# Patient Record
Sex: Female | Born: 1960 | Race: White | Hispanic: No | Marital: Married | Smoking: Former smoker
Health system: Southern US, Community
[De-identification: ages and names within clinical notes are randomized; demographics above are authoritative.]

## PROBLEM LIST (undated history)

## (undated) DIAGNOSIS — Z8619 Personal history of other infectious and parasitic diseases: Secondary | ICD-10-CM

## (undated) DIAGNOSIS — I871 Compression of vein: Secondary | ICD-10-CM

## (undated) DIAGNOSIS — I10 Essential (primary) hypertension: Secondary | ICD-10-CM

## (undated) DIAGNOSIS — K219 Gastro-esophageal reflux disease without esophagitis: Secondary | ICD-10-CM

## (undated) DIAGNOSIS — M7918 Myalgia, other site: Secondary | ICD-10-CM

## (undated) HISTORY — DX: Compression of vein: I87.1

## (undated) HISTORY — DX: Personal history of other infectious and parasitic diseases: Z86.19

## (undated) HISTORY — DX: Essential (primary) hypertension: I10

## (undated) HISTORY — DX: Gastro-esophageal reflux disease without esophagitis: K21.9

## (undated) HISTORY — DX: Myalgia, other site: M79.18

---

## 2007-11-17 ENCOUNTER — Ambulatory Visit (HOSPITAL_COMMUNITY): Admission: RE | Admit: 2007-11-17 | Discharge: 2007-11-17 | Payer: Self-pay | Admitting: Obstetrics and Gynecology

## 2010-08-21 ENCOUNTER — Other Ambulatory Visit (HOSPITAL_COMMUNITY): Payer: Self-pay | Admitting: Obstetrics and Gynecology

## 2010-08-21 DIAGNOSIS — Z1231 Encounter for screening mammogram for malignant neoplasm of breast: Secondary | ICD-10-CM

## 2010-09-01 ENCOUNTER — Ambulatory Visit (HOSPITAL_COMMUNITY)
Admission: RE | Admit: 2010-09-01 | Discharge: 2010-09-01 | Disposition: A | Discharge status: Home / Self Care | Payer: 59 | Source: Ambulatory Visit | Attending: Obstetrics and Gynecology | Admitting: Obstetrics and Gynecology

## 2010-09-01 ENCOUNTER — Encounter (HOSPITAL_COMMUNITY): Payer: Self-pay

## 2010-09-01 DIAGNOSIS — Z1231 Encounter for screening mammogram for malignant neoplasm of breast: Secondary | ICD-10-CM | POA: Insufficient documentation

## 2015-09-17 ENCOUNTER — Ambulatory Visit: Payer: Managed Care, Other (non HMO) | Attending: Obstetrics and Gynecology | Admitting: Physical Therapy

## 2015-09-17 ENCOUNTER — Encounter: Payer: Self-pay | Admitting: Physical Therapy

## 2015-09-17 DIAGNOSIS — R29898 Other symptoms and signs involving the musculoskeletal system: Secondary | ICD-10-CM

## 2015-09-17 DIAGNOSIS — M791 Myalgia, unspecified site: Secondary | ICD-10-CM

## 2015-09-17 DIAGNOSIS — M6289 Other specified disorders of muscle: Secondary | ICD-10-CM | POA: Diagnosis not present

## 2015-09-17 DIAGNOSIS — N949 Unspecified condition associated with female genital organs and menstrual cycle: Secondary | ICD-10-CM | POA: Diagnosis present

## 2015-09-17 DIAGNOSIS — R102 Pelvic and perineal pain: Secondary | ICD-10-CM

## 2015-09-17 DIAGNOSIS — R198 Other specified symptoms and signs involving the digestive system and abdomen: Secondary | ICD-10-CM | POA: Diagnosis present

## 2015-09-17 NOTE — Therapy (Signed)
[facility] [address] [place], [place], [address] Phone: [phone]   Fax:  [phone]  Physical Therapy Evaluation  Patient Details  Name: Jessica Lane MRN: [id number] Date of Birth: 1960/12/26 Referring Provider: Dr. Hollice Gong  Encounter Date: 09/17/2015      PT End of Session - 09/17/15 1645    Visit Number 1   Date for PT Re-Evaluation 01/17/16   PT Start Time 1615   PT Stop Time 1655   PT Time Calculation (min) 40 min   Activity Tolerance Patient tolerated treatment well   Behavior During Therapy [facility] for tasks assessed/performed      History reviewed. No pertinent past medical history.  Past Surgical History  Procedure Laterality Date  . Cesarean section      There were no vitals filed for this visit.  Visit Diagnosis:  Weakness of both hips - Plan: PT plan of care cert/re-cert  Abdominal weakness - Plan: PT plan of care cert/re-cert  Perineal pain in female - Plan: PT plan of care cert/re-cert  Muscle pain - Plan: PT plan of care cert/re-cert      Subjective Assessment - 09/17/15 1615    Subjective Patient reports pain 2 years ago in left hip making it difficult to play tennis.  Patient had increased pain in vaginal area after long period of walking.    Limitations Sitting   How long can you sit comfortably? 30 min.    How long can you walk comfortably? painful   Patient Stated Goals find way to understand exercises to reduce pain   Currently in Pain? Yes   Pain Score 4    Pain Location Vagina  bil. 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 Hollice Gong   Onset Date/Surgical Date 07/12/13   Prior Therapy None   Precautions   Precautions None   Balance Screen   Has the patient fallen in the past 6 months Yes   How many times? 1  running for tennis ball and trip over feet onto left arm   Has the patient had a decrease in activity level because of a fear of falling?  No   Is the patient reluctant to leave their home because of a fear of falling?  No   Prior Function   Level of Independence Independent   Cognition   Overall Cognitive Status Within Functional Limits for tasks assessed   Observation/Other Assessments   Focus on Therapeutic Outcomes (FOTO)  29% limitation   Posture/Postural Control   Posture/Postural Control No significant limitations   AROM   Lumbar Flexion decreased by 25%   Lumbar Extension decreased by 50%   Strength   Overall Strength Comments bil. hip strength is 4/5   Palpation   Spinal mobility T8-L5 decreased P-A mobility   SI assessment  left ilium is anteriorly rotated; rotated right   Palpation comment tenderness located in left psoas, left diaphgram; left gluteal  Pelvic Compression   Findings Positive   Side Left   comment pain                 Pelvic Floor Special Questions - 09/17/15 0001    Prior Pregnancies Yes   Number of Pregnancies 2   Number of C-Sections 1   Number of Vaginal Deliveries 1   Currently Sexually Active No   Marinoff Scale --  when legs are apart pain in vaginal muscles   Urinary Leakage No   External Perineal Exam no palpable tenderness located in external pelvic floor   Skin Integrity Intact   Perineal Body/Introitus  Elevated   Pelvic Floor Internal Exam Patient confirms identificaltion and approves PT to assess muscle integrity    Exam Type Vaginal   Palpation tenderness located in the vulvuar area, therapist is unable to place index finger into the  vaginal canal due to pain and tightness   Tone high tone                  PT Education - 09/17/15 1639    Education provided Yes   Education Details education on melt exericses to release the hip flexors, hip adductors, piriformis   Person(s) Educated Patient   Methods Explanation;Demonstration;Verbal cues   Comprehension Returned demonstration;Verbalized understanding          PT Short Term Goals - 09/17/15 1701    PT SHORT TERM GOAL #1   Title Independent with flexibility exericses   Time 4   Period Weeks   Status New   PT SHORT TERM GOAL #2   Title understand diaphragmatic breathing to relax the pelvic floor muscles   Time 4   Period Weeks   Status New   PT SHORT TERM GOAL #3   Title ability to bring her legs apart and use a q-tip to touch the vulvar area with pain no worse than 3/10.    Time 4   Period Weeks   Status New   PT SHORT TERM GOAL #4   Title walk with pain decreased >/= 25% due to improved flexibility of bil. lower extremities   Time 4   Period Weeks   Status New   PT SHORT TERM GOAL #5   Title understand how to use home TENS unit for pain management   Time 4   Period Months   Status New           PT Long Term Goals - 09/17/15 1703    PT LONG TERM GOAL #1   Title independent with HEP and understands how to progress herself  including pain management   Time 4   Period Months   Status New   PT LONG TERM GOAL #2   Title ability to walk with pain decreased >/= 75% due to improve flexibility of LE and perineal area   Time 4   Period Months   Status New   PT LONG TERM GOAL #3   Title ability to have a vaginal exam due to the introitus enlarged in diameter with pain decreased </= 3/10   Time 4   Period Months   Status New   PT LONG TERM GOAL #4   Title able to sit or stand with 75% decrease in pain    Time 4   Period Months   Status New               Plan - 09/17/15 1647    Clinical Impression Statement Patient  is a 55  year old female with diagnosis of spastic pelvic floor that began suddenly 2 years ago. Patient reports her pain is 4/10 in vaginal area, abdominal, bil. hips and low back. Pain is constant and is getting worse.  Patient reports her pain is worse with walking, bringing legs apart, standing, sitting, and sleeping on side. Bilateral hip strength is 4/5 and abdominal strength is 3/5.  Patient has tight bil. hamstring, quads, hip adductors, hip rotators.  Lumbar flexion decreased by 25% and extension decreased by 50%.  Left ilium is rotated anteriorly and sacrum is rotated right.  Palpable tenderness located in left diaphgram, left gluteal, and left psoas.  Therapist was unable to place her index finger into the vaginal canal due to pain and introitus was decreased in size.  FOTO score is 29% limitation.  Patient is of low complexity.  Patient will benefit from physical therapy to reduce pain and improve flexibility.    Pt will benefit from skilled therapeutic intervention in order to improve on the following deficits Increased fascial restricitons;Pain;Decreased mobility;Increased muscle spasms;Decreased activity tolerance;Decreased endurance;Decreased range of motion;Decreased strength;Hypomobility;Impaired flexibility   Rehab Potential Excellent   Clinical Impairments Affecting Rehab Potential None   PT Frequency 2x / week   PT Duration Other (comment)  4 months   PT Treatment/Interventions ADLs/Self Care Home Management;Biofeedback;Cryotherapy;Electrical Stimulation;Ultrasound;Moist Heat;Functional mobility training;Therapeutic activities;Therapeutic exercise  dilator, home TENS unit   PT Next Visit Plan flexibility exericses, ways to manage pain, soft tissue work, diaphragmatic breathing, educate patient on home TENS unit   PT Home Exercise Plan how to use home TENS unit, flexibility exericse   Recommended Other Services None   Consulted and Agree with Plan of Care Patient         Problem  List There are no active problems to display for this patient.   Eulis FosterCheryl Shaunak Kreis, PT 09/17/2015 5:07 PM   [facility] [address], [address] [place], [place], [address] Phone: [phone]   Fax:  [phone]  Name: Jessica Lane MRN: [id number] Date of Birth: March 07, 1961

## 2015-09-19 ENCOUNTER — Encounter: Payer: Self-pay | Admitting: Physical Therapy

## 2015-09-24 ENCOUNTER — Encounter: Payer: Self-pay | Admitting: Physical Therapy

## 2015-09-24 ENCOUNTER — Ambulatory Visit: Payer: Managed Care, Other (non HMO) | Admitting: Physical Therapy

## 2015-09-24 DIAGNOSIS — R198 Other specified symptoms and signs involving the digestive system and abdomen: Secondary | ICD-10-CM

## 2015-09-24 DIAGNOSIS — R29898 Other symptoms and signs involving the musculoskeletal system: Secondary | ICD-10-CM

## 2015-09-24 DIAGNOSIS — M791 Myalgia, unspecified site: Secondary | ICD-10-CM

## 2015-09-24 DIAGNOSIS — M6289 Other specified disorders of muscle: Secondary | ICD-10-CM | POA: Diagnosis not present

## 2015-09-24 NOTE — Therapy (Signed)
[facility] [address], [address] [place], [place], [address] Phone: [phone]   Fax:  [phone]  Physical Therapy Treatment  Patient Details  Name: Jessica Lane MRN: [id number] Date of Birth: May 14, 1961 Referring Provider: Dr. Hollice GongLauren Schiff  Encounter Date: 09/24/2015      PT End of Session - 09/24/15 1613    Visit Number 2   Date for PT Re-Evaluation 01/17/16   PT Start Time 1610   PT Stop Time 1645   PT Time Calculation (min) 35 min   Activity Tolerance Patient tolerated treatment well   Behavior During Therapy [facility] for tasks assessed/performed      History reviewed. No pertinent past medical history.  Past Surgical History  Procedure Laterality Date  . Cesarean section      There were no vitals filed for this visit.  Visit Diagnosis:  Weakness of both hips  Abdominal weakness  Muscle pain      Subjective Assessment - 09/24/15 1613    Subjective Been super busy. Lt hip was sore after eval. Has only been able to use her foam roll a little. Brought in TENS unit to review.   Currently in Pain? No/denies   Multiple Pain Sites No                         [facility] Adult PT Treatment/Exercise - 09/24/15 0001    Lumbar Exercises: Supine   Other Supine Lumbar Exercises MELT protocol:                 PT Education - 09/24/15 1625    Education provided Yes   Education Details Review of TENS unit for home, where to place for pelvic pain.   Person(s) Educated Patient   Methods Explanation;Demonstration   Comprehension Verbalized understanding;Returned demonstration          PT Short Term Goals - 09/24/15 1621    PT SHORT TERM GOAL #1   Title Independent with flexibility exericses   Time 4   Period Weeks   Status Achieved   PT SHORT TERM GOAL #2   Title understand diaphragmatic breathing to relax the pelvic floor muscles   Time 4   Period Weeks   Status Achieved   PT SHORT  TERM GOAL #4   Title walk with pain decreased >/= 25% due to improved flexibility of bil. lower extremities   Time 4   Status On-going  Early in treatment   PT SHORT TERM GOAL #5   Title understand how to use home TENS unit for pain management   Time 4   Period Months   Status Achieved           PT Long Term Goals - 09/17/15 1703    PT LONG TERM GOAL #1   Title independent with HEP and understands how to progress herself  including pain management   Time 4   Period Months   Status New   PT LONG TERM GOAL #2   Title ability to walk with pain decreased >/= 75% due to improve flexibility of LE and perineal area   Time 4   Period Months   Status New   PT LONG TERM GOAL #3   Title ability to have a vaginal exam due to the introitus enlarged in diameter with pain decreased </= 3/10   Time 4   Period Months   Status New   PT LONG TERM GOAL #4  Title able to sit or stand with 75% decrease in pain    Time 4   Period Months   Status New               Plan - 09/24/15 1613    Clinical Impression Statement Pt reports being very busy and having trouble finding time to do the release work. Educated pt in use of TENS unit for pelvic pain. She is sound in her technique for release work with foam roll.    Pt will benefit from skilled therapeutic intervention in order to improve on the following deficits Increased fascial restricitons;Pain;Decreased mobility;Increased muscle spasms;Decreased activity tolerance;Decreased endurance;Decreased range of motion;Decreased strength;Hypomobility;Impaired flexibility   Rehab Potential Excellent   Clinical Impairments Affecting Rehab Potential None   PT Frequency 2x / week   PT Duration Other (comment)   PT Treatment/Interventions ADLs/Self Care Home Management;Biofeedback;Cryotherapy;Electrical Stimulation;Ultrasound;Moist Heat;Functional mobility training;Therapeutic activities;Therapeutic exercise   PT Next Visit Plan Core strength with  foam roll.    Consulted and Agree with Plan of Care Patient        Problem List There are no active problems to display for this patient.   Mcgwire Dasaro, PTA 09/24/2015, 4:46 PM  [facility] [address] [place], [place], [address] Phone: [phone]   Fax:  [phone]  Name: Jessica Lane MRN: [id number] Date of Birth: 01/22/1961

## 2015-10-03 ENCOUNTER — Ambulatory Visit: Payer: Managed Care, Other (non HMO) | Admitting: Physical Therapy

## 2015-10-03 ENCOUNTER — Encounter: Payer: Self-pay | Admitting: Physical Therapy

## 2015-10-03 DIAGNOSIS — M6289 Other specified disorders of muscle: Secondary | ICD-10-CM | POA: Diagnosis not present

## 2015-10-03 DIAGNOSIS — R29898 Other symptoms and signs involving the musculoskeletal system: Secondary | ICD-10-CM

## 2015-10-03 DIAGNOSIS — M791 Myalgia, unspecified site: Secondary | ICD-10-CM

## 2015-10-03 DIAGNOSIS — R198 Other specified symptoms and signs involving the digestive system and abdomen: Secondary | ICD-10-CM

## 2015-10-03 DIAGNOSIS — R102 Pelvic and perineal pain: Secondary | ICD-10-CM

## 2015-10-03 DIAGNOSIS — N949 Unspecified condition associated with female genital organs and menstrual cycle: Secondary | ICD-10-CM

## 2015-10-03 NOTE — Patient Instructions (Addendum)
Home TENS unit: one lead wire on left sacrum right inner thigh or low abdominal, other lead wire on right sacrum, left inner thigh or low abdominal.  Isometric Hold (Hook-Lying)    Lie with hips and knees bent. Slowly inhale, and then exhale. Pull navel toward spine and Hold for _5__ seconds. Continue to breathe in and out during hold. Rest for _1__ seconds. Repeat _10__ times. Do _2__ times a day.   Copyright  VHI. All rights reserved.  Bracing With Knee Fallout (Hook-Lying)    With neutral spine, tighten pelvic floor and abdominals and hold. Alternating legs, drop knee out to side. Keep opposite hip still. Repeat __10_ times. Then do other leg.  Do _1__ times a day.   Copyright  VHI. All rights reserved.  Lubrication . Used for intercourse to reduce friction . Avoid ones that have glycerin, warming gels, tingling gels, icing or cooling gel, scented . May need to be reapplied once or several times during sexual activity . Can be applied to both partners genitals prior to vaginal penetration to minimize friction or irritation . Prevent irritation and mucosal tears that cause post coital pain and increased the risk of vaginal and urinary tract infections . Oil-based lubricants cannot be used with condoms due to breaking them down.  Least likely to irritate vaginal tissue.  . Plant based-lubes are safe . Silicone-based lubrication are thicker and last long and used for post-menopausal women Types of Lubricants . Good Clean Love (water based)-[employer], [employer], [employer], [employer] . Slippery Stuff(water based) [employer] . Sylk (water based) [employer], [employer] . Blossom Organics- drug store; www.blossom-organics.com . Leatrice JewelsLuvena- Drug store . Coconut oil- will breakdown condoms, least irritating . Aloe Vera- least irritating . Sliquid Natural H20 (water based)-[employer], good if frequent UTI's . Wet Platinum- (Silicone) [employer], [employer] . Yes Lubricant- Amazon . [place] Jelly, Replens, and Astroglide  kills good Bacteria (lactobadilli)  Things to avoid in the vaginal area . Do not use things to irritate the vulvar area . No lotions . No soaps; can use Aveeno or Calendula cleanser if needed. Must be gentle . No deodorants . No douches . Good to sleep without underwear to let the vaginal area to air out . No scrubbing: spread the lips to let warm water rinse over labias and pat dry   Moisturizers . They are used in the vagina to hydrate the mucous membrane that make up the vaginal canal. . Designed to keep a more normal acid balance (ph) . Once placed in the vagina, it will last between two to three days.  . Use 2-3 times per week at bedtime and last longer than 60 min. . Ingredients to avoid is glycerin and fragrance, can increase chance of infection . Should not be used just before sex due to causing irritation . Most are gels administered either in a tampon-shaped applicator or as a vaginal suppository. They are non-hormonal.   Types of Moisturizers . Replens- drug store . Le[employer]vena- drug store . Vitamin E vaginal suppositories- Whole foods . Moist Again . Coconut oil- can break down condoms  Things to avoid in the vaginal area . Do not use things to irritate the vulvar area . No lotions . No soaps; can use Aveeno or Calendula cleanser if needed. Must be gentle . No deodorants . No douches . Good to sleep without underwear to let the vaginal area to air out . No scrubbing: spread the lips to let warm water rinse over labias and pat dry  Daily: use a q-tip and lubricant, rub lubricant on the vaginal area and into the vaginal canal.  Touch around the labia minora with q-tip then insert into the vagina and hold 1 min.      [facility] [address], [address] [place], [place] [address] Phone # [phone] Fax [phone]

## 2015-10-03 NOTE — Therapy (Signed)
[facility] [address] [place], [place], [address] Phone: [phone]   Fax:  [phone]  Physical Therapy Treatment  Patient Details  Name: Jessica Lane MRN: [id number] Date of Birth: 1961-01-24 Referring Provider: Dr. Hollice Gong  Encounter Date: 10/03/2015      PT End of Session - 10/03/15 0803    Visit Number 3   Date for PT Re-Evaluation 01/17/16   Authorization Type [employer]-   Authorization - Visit Number 3   Authorization - Number of Visits 20   PT Start Time 0804   PT Stop Time 0842   PT Time Calculation (min) 38 min   Activity Tolerance Patient tolerated treatment well   Behavior During Therapy [facility] for tasks assessed/performed      History reviewed. No pertinent past medical history.  Past Surgical History  Procedure Laterality Date  . Cesarean section      There were no vitals filed for this visit.  Visit Diagnosis:  Weakness of both hips  Abdominal weakness  Muscle pain  Perineal pain in female      Subjective Assessment - 10/03/15 0804    Subjective I feel great after therapy but the next few days the hips and back are sore.    Limitations Sitting   How long can you sit comfortably? 30 min.    How long can you walk comfortably? painful   Patient Stated Goals find way to understand exercises to reduce pain   Currently in Pain? Yes   Pain Score 4    Pain Location Vagina  bil. hips   Pain Orientation Right;Left;Mid   Pain Descriptors / Indicators Dull   Pain Type Chronic pain   Pain Onset More than a month ago   Pain Frequency Constant   Aggravating Factors  walking   Pain Relieving Factors cymbalta   Multiple Pain Sites No                         [facility] Adult PT Treatment/Exercise - 10/03/15 0001    Self-Care   Self-Care Other Self-Care Comments   Other Self-Care Comments  home TENS unit electrode placement for pelvic pain; using moiturizers and lubricants  in the vaginal area   Manual Therapy   Manual Therapy Other (comment);Soft tissue mobilization   Soft tissue mobilization educated patient on using the q-tip for placing lubricant onto the vaginal perineum, placing into the vaginal canal, hold q-tip into the introitus for 1 min.    Other Manual Therapy manual stretch of bil. hips for adductors and rotators                PT Education - 10/03/15 0815    Education provided Yes   Education Details Home TENS unit, abdominal bracing, abdominal bracing with hip in/out; moisturizers, lubricants, how to use q-tip   Person(s) Educated Patient   Methods Explanation;Demonstration;Verbal cues;Handout   Comprehension Returned demonstration;Verbalized understanding          PT Short Term Goals - 10/03/15 0900    PT SHORT TERM GOAL #1   Title Independent with flexibility exericses   Time 4   Period Weeks   Status Achieved   PT SHORT TERM GOAL #2   Title understand diaphragmatic breathing to relax the pelvic floor muscles   Time 4   Period Weeks   Status Achieved   PT SHORT TERM GOAL #3   Title ability to bring her legs apart and use  a q-tip to touch the vulvar area with pain no worse than 3/10.    Time 4   Period Weeks   Status On-going  just learned, no pain   PT SHORT TERM GOAL #4   Title walk with pain decreased >/= 25% due to improved flexibility of bil. lower extremities   Time 4   Period Weeks   Status On-going  early in treatment   PT SHORT TERM GOAL #5   Title understand how to use home TENS unit for pain management   Time 4   Period Weeks   Status Achieved           PT Long Term Goals - 09/17/15 1703    PT LONG TERM GOAL #1   Title independent with HEP and understands how to progress herself  including pain management   Time 4   Period Months   Status New   PT LONG TERM GOAL #2   Title ability to walk with pain decreased >/= 75% due to improve flexibility of LE and perineal area   Time 4   Period Months    Status New   PT LONG TERM GOAL #3   Title ability to have a vaginal exam due to the introitus enlarged in diameter with pain decreased </= 3/10   Time 4   Period Months   Status New   PT LONG TERM GOAL #4   Title able to sit or stand with 75% decrease in pain    Time 4   Period Months   Status New               Plan - 10/03/15 0841    Clinical Impression Statement Patient has learned how to use the q-tip for insertion of the vaginal to become use to touching the area.  Patient has learned about moisturizers and lubricants to decrease vaginal dryness and will help with pain.  Patient should not contract the vaginal canal due to tightness.  Patient has difficulty with diaphgramatic breathing  due to air going into the chest not the abdomen.  Patient has a better understanding on how to use the Hpem TENS unit on the vaginal area. Patient would benefit from physical therapy to imporve flexibility and reduce pain.    Pt will benefit from skilled therapeutic intervention in order to improve on the following deficits Increased fascial restricitons;Pain;Decreased mobility;Increased muscle spasms;Decreased activity tolerance;Decreased endurance;Decreased range of motion;Decreased strength;Hypomobility;Impaired flexibility   Rehab Potential Excellent   Clinical Impairments Affecting Rehab Potential No pelvic floor contraction due to tightness   PT Frequency 2x / week   PT Duration Other (comment)   PT Treatment/Interventions ADLs/Self Care Home Management;Biofeedback;Cryotherapy;Electrical Stimulation;Ultrasound;Moist Heat;Functional mobility training;Therapeutic activities;Therapeutic exercise   PT Next Visit Plan flexibility exercises for hips- happy baby, hip adductor, hi p rotators, hip flexor, prayer stretch, butterfly stretch, soft tissue work on outside perineum, and hip adductors; Check pelvic alignment   PT Home Exercise Plan flexibility exercises   Consulted and Agree with Plan of  Care Patient        Problem List There are no active problems to display for this patient.   Jessica Lane, PT 10/03/2015 9:03 AM   [facility] [address], [address] [place], [place], [address] Phone: [phone]   Fax:  [phone]  Name: Jessica Lane MRN: [id number] Date of Birth: [birth year]

## 2015-10-13 ENCOUNTER — Encounter: Payer: Self-pay | Admitting: Physical Therapy

## 2015-10-13 ENCOUNTER — Ambulatory Visit: Payer: Managed Care, Other (non HMO) | Attending: Obstetrics and Gynecology | Admitting: Physical Therapy

## 2015-10-13 DIAGNOSIS — R198 Other specified symptoms and signs involving the digestive system and abdomen: Secondary | ICD-10-CM | POA: Insufficient documentation

## 2015-10-13 DIAGNOSIS — M6289 Other specified disorders of muscle: Secondary | ICD-10-CM | POA: Insufficient documentation

## 2015-10-13 DIAGNOSIS — M791 Myalgia: Secondary | ICD-10-CM | POA: Insufficient documentation

## 2015-10-13 DIAGNOSIS — M62838 Other muscle spasm: Secondary | ICD-10-CM | POA: Diagnosis present

## 2015-10-13 NOTE — Therapy (Addendum)
[facility] [address], [place], [place], [address] Phone: [phone]   Fax:  [phone]  Physical Therapy Treatment  Patient Details  Name: Nivea Wojdyla MRN: [id number] Date of Birth: 07-31-1960 Referring Provider: Dr. Yvette Rack  Encounter Date: 10/13/2015      PT End of Session - 10/20/15 1626    Visit Number 5   Date for PT Re-Evaluation 01/17/16   Authorization Type [employer]-   Authorization - Visit Number 5   PT Start Time 2725   PT Stop Time 1700   PT Time Calculation (min) 36 min   Activity Tolerance Patient tolerated treatment well   Behavior During Therapy [facility] for tasks assessed/performed      History reviewed. No pertinent past medical history.  Past Surgical History  Procedure Laterality Date  . Cesarean section      There were no vitals filed for this visit.  Visit Diagnosis:  Other muscle spasm      Subjective Assessment - 10/20/15 1624    Subjective I am feeling better.  We are down to 1/10.    How long can you sit comfortably? several hours   How long can you walk comfortably? walking 95% better.  pain is not at opening instead deeper.    Patient Stated Goals find way to understand exercises to reduce pain   Currently in Pain? 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   Onset Date/Surgical Date 07/12/13   Prior Therapy None   Precautions   Precautions None   Prior Function   Level of Independence Independent   Cognition   Overall Cognitive Status Within Functional Limits for tasks assessed   Observation/Other Assessments   Focus on Therapeutic Outcomes (FOTO)  29% limitation   Posture/Postural Control   Posture/Postural Control No significant limitations   Palpation   SI assessment  pelvis in correct alignment   Pelvic Compression   Findings Negative   comment no pain                  Pelvic Floor Special Questions - 10/20/15 0001    Pelvic Floor Internal Exam Patient confirms identificaltion and approves PT to assess muscle integrity    Exam Type Vaginal   Strength fair squeeze, definite lift           [facility] Adult PT Treatment/Exercise - 10/20/15 0001    Manual Therapy   Manual Therapy Soft tissue mobilization   Soft tissue mobilization left diaphragm and psoas                PT Education - 10/20/15 1655    Education provided Yes   Education Details perineal soft tissue work   Forensic psychologist) Educated Patient   Methods Explanation;Handout;Verbal cues   Comprehension Verbalized understanding          PT Short Term Goals - 10/20/15 1658    PT SHORT TERM GOAL #1   Title Independent with flexibility exericses   Time 4   Period Weeks   Status  Achieved   PT SHORT TERM GOAL #2   Title understand diaphragmatic breathing to relax the pelvic floor muscles   Time 4   Period Weeks   Status Achieved   PT SHORT TERM GOAL #3   Title ability to bring her legs apart and use a q-tip to touch the vulvar area with pain no worse than 3/10.    Time 4   Period Weeks   Status Achieved   PT SHORT TERM GOAL #4   Title walk with pain decreased >/= 25% due to improved flexibility of bil. lower extremities   Time 4   Period Weeks   Status Achieved   PT SHORT TERM GOAL #5   Title understand how to use home TENS unit for pain management   Time 4   Period Weeks   Status Achieved           PT Long Term Goals - 10/20/15 1625    PT LONG TERM GOAL #1   Title independent with HEP and understands how to progress herself  including pain management   Time 4   Period Months   Status  On-going   PT LONG TERM GOAL #2   Title ability to walk with pain decreased >/= 75% due to improve flexibility of LE and perineal area   Time 4   Period Months   Status Achieved   PT LONG TERM GOAL #3   Title ability to have a vaginal exam due to the introitus enlarged in diameter with pain decreased </= 3/10   Time 4   Period Months   Status On-going   PT LONG TERM GOAL #4   Title able to sit or stand with 75% decrease in pain    Time 4   Period Months   Status On-going               Plan - 10/20/15 1651    Clinical Impression Statement Patient was able to tolerate internal soft tissue work for first time. Patient reports her pain is decreased by 95% better.  Patient can walk with no external perineal pain and has internal pain.  Patient is indepednetn with HEP.  Pelvic floor strength is 3/5.  Pelvis in correct  alignment. Patient has met all of her STG's. Patient wil lbenefit form physcial therapy to reduce pain and improve pelvic floor extensibility. .    Rehab Potential Excellent   Clinical Impairments Affecting Rehab Potential No pelvic floor contraction due to tightness   PT Frequency 2x / week   PT Duration Other (comment)  4 months   PT Treatment/Interventions ADLs/Self Care Home Management;Biofeedback;Cryotherapy;Electrical Stimulation;Ultrasound;Moist Heat;Functional mobility training;Therapeutic activities;Therapeutic exercise   PT Next Visit Plan soft tissue work, core stabilization   PT Home Exercise Plan progress as needed   Recommended Other Services None   Consulted and Agree with Plan of Care Patient        Problem List There are no active problems to display for this patient.   GRAY,CHERYL, PTA 10/20/2015, 5:06 PM  [facility] [address], [place], [place], [address] Phone: [phone]   Fax:  [phone]  Name: Maryama Kuriakose MRN: [id number] Date of Birth: Jul 12, 1961

## 2015-10-20 ENCOUNTER — Encounter: Payer: Self-pay | Admitting: Physical Therapy

## 2015-10-20 ENCOUNTER — Ambulatory Visit: Payer: Managed Care, Other (non HMO) | Admitting: Physical Therapy

## 2015-10-20 DIAGNOSIS — M62838 Other muscle spasm: Secondary | ICD-10-CM | POA: Diagnosis not present

## 2015-10-20 NOTE — Patient Instructions (Signed)
STRETCHING THE PELVIC FLOOR MUSCLES NO DILATOR  Supplies . Vaginal lubricant . Mirror (optional) . Gloves (optional) Positioning . Start in a semi-reclined position with your head propped up. Bend your knees and place your thumb or finger at the vaginal opening. Procedure . Apply a moderate amount of lubricant on the outer skin of your vagina, the labia minora.  Apply additional lubricant to your finger. Marland Kitchen. Spread the skin away from the vaginal opening. Place the end of your finger at the opening. . Do a maximum contraction of the pelvic floor muscles. Tighten the vagina and the anus maximally and relax. . When you know they are relaxed, gently and slowly insert your finger into your vagina, directing your finger slightly downward, for 2-3 inches of insertion. . Relax and stretch the 6 o'clock position . Hold each stretch for _2 min__ and repeat __1_ time with rest breaks of _1__ seconds between each stretch. . Repeat the stretching in the 4 o'clock and 8 o'clock positions. . Total time should be _6__ minutes, _1__ x per day.  Note the amount of theme your were able to achieve and your tolerance to your finger in your vagina. . Once you have accomplished the techniques you may try them in standing with one foot resting on the tub, or in other positions.  This is a good stretch to do in the shower if you don't need to use lubricant.    Also massage internally in the pelvic floor muscles to relax them 5 min. Per day in a reclined postion.   [facility] [address], [address] [place], [place] [address] Phone # [phone] Fax [phone]

## 2015-10-20 NOTE — Therapy (Signed)
[facility] [address], [place], [place], [address] Phone: [phone]   Fax:  [phone]  Physical Therapy Treatment  Patient Details  Name: Jessica Lane MRN: [id number] Date of Birth: 03/27/1961 Referring Provider: Dr. Yvette Rack  Encounter Date: 10/20/2015      PT End of Session - 10/20/15 1626    Visit Number 5   Date for PT Re-Evaluation 01/17/16   Authorization Type [employer]-   Authorization - Visit Number 5   PT Start Time 4008   PT Stop Time 1700   PT Time Calculation (min) 36 min   Activity Tolerance Patient tolerated treatment well   Behavior During Therapy [facility] for tasks assessed/performed      History reviewed. No pertinent past medical history.  Past Surgical History  Procedure Laterality Date  . Cesarean section      There were no vitals filed for this visit.      Subjective Assessment - 10/20/15 1624    Subjective I am feeling better.  We are down to 1/10.    How long can you sit comfortably? several hours   How long can you walk comfortably? walking 95% better.  pain is not at opening instead deeper.    Patient Stated Goals find way to understand exercises to reduce pain   Currently in Pain? 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   Onset Date/Surgical Date 07/12/13   Prior Therapy None   Precautions   Precautions None   Prior Function   Level of Independence Independent   Cognition   Overall Cognitive Status Within Functional Limits for tasks assessed   Observation/Other Assessments   Focus on Therapeutic Outcomes (FOTO)  29%  limitation   Posture/Postural Control   Posture/Postural Control No significant limitations   Palpation   SI assessment  pelvis in correct alignment   Pelvic Compression   Findings Negative   comment no pain                  Pelvic Floor Special Questions - 10/20/15 0001    Pelvic Floor Internal Exam Patient confirms identificaltion and approves PT to assess muscle integrity    Exam Type Vaginal   Strength fair squeeze, definite lift           [facility] Adult PT Treatment/Exercise - 10/20/15 0001    Manual Therapy   Manual Therapy Soft tissue mobilization   Soft tissue mobilization left diaphragm and psoas                PT Education - 10/20/15 1655    Education provided Yes   Education Details perineal soft tissue work   Forensic psychologist) Educated Patient   Methods Explanation;Handout;Verbal cues   Comprehension Verbalized understanding          PT Short Term Goals - 10/20/15 1658    PT SHORT TERM GOAL #1   Title Independent with flexibility exericses   Time 4   Period Weeks   Status Achieved   PT SHORT TERM  GOAL #2   Title understand diaphragmatic breathing to relax the pelvic floor muscles   Time 4   Period Weeks   Status Achieved   PT SHORT TERM GOAL #3   Title ability to bring her legs apart and use a q-tip to touch the vulvar area with pain no worse than 3/10.    Time 4   Period Weeks   Status Achieved   PT SHORT TERM GOAL #4   Title walk with pain decreased >/= 25% due to improved flexibility of bil. lower extremities   Time 4   Period Weeks   Status Achieved   PT SHORT TERM GOAL #5   Title understand how to use home TENS unit for pain management   Time 4   Period Weeks   Status Achieved           PT Long Term Goals - 10/20/15 1625    PT LONG TERM GOAL #1   Title independent with HEP and understands how to progress herself  including pain management   Time 4   Period Months   Status On-going   PT LONG TERM GOAL #2   Title  ability to walk with pain decreased >/= 75% due to improve flexibility of LE and perineal area   Time 4   Period Months   Status Achieved   PT LONG TERM GOAL #3   Title ability to have a vaginal exam due to the introitus enlarged in diameter with pain decreased </= 3/10   Time 4   Period Months   Status On-going   PT LONG TERM GOAL #4   Title able to sit or stand with 75% decrease in pain    Time 4   Period Months   Status On-going               Plan - 10/20/15 1651    Clinical Impression Statement Patient was able to tolerate internal soft tissue work for first time. Patient reports her pain is decreased by 95% better.  Patient can walk with no external perineal pain and has internal pain.  Patient is indepednetn with HEP.  Pelvic floor strength is 3/5.  Pelvis in correct  alignment. Patient has met all of her STG's. Patient wil lbenefit form physcial therapy to reduce pain and improve pelvic floor extensibility. .    Rehab Potential Excellent   Clinical Impairments Affecting Rehab Potential No pelvic floor contraction due to tightness   PT Frequency 2x / week   PT Duration Other (comment)  4 months   PT Treatment/Interventions ADLs/Self Care Home Management;Biofeedback;Cryotherapy;Electrical Stimulation;Ultrasound;Moist Heat;Functional mobility training;Therapeutic activities;Therapeutic exercise   PT Next Visit Plan soft tissue work, core stabilization   PT Home Exercise Plan progress as needed   Recommended Other Services None   Consulted and Agree with Plan of Care Patient      Patient will benefit from skilled therapeutic intervention in order to improve the following deficits and impairments:  Increased fascial restricitons, Pain, Decreased mobility, Increased muscle spasms, Decreased activity tolerance, Decreased endurance, Decreased range of motion, Decreased strength, Hypomobility, Impaired flexibility  Visit Diagnosis: Other muscle spasm - Plan: PT plan of care  cert/re-cert     Problem List There are no active problems to display for this patient.   Earlie Counts, PT 10/20/2015 5:02 PM   [facility] [address], [place], [place], [address] Phone: [phone]   Fax:  [phone]  Name: Clea Dubach MRN:  [id number] Date of Birth: [birth year]

## 2015-11-04 ENCOUNTER — Ambulatory Visit: Payer: Managed Care, Other (non HMO) | Admitting: Physical Therapy

## 2015-11-04 DIAGNOSIS — M62838 Other muscle spasm: Secondary | ICD-10-CM | POA: Diagnosis not present

## 2015-11-04 NOTE — Patient Instructions (Signed)
Iliotibial Band Stretch, Side-Lying    Lie on side, back to edge of bed, top arm in front. Allow top leg to drape behind over edge. Hold _30__ seconds.  Repeat _2__ times per session. Do __1 sessions per day.  Copyright  VHI. All rights reserved.    Standing with feet about shoulder width apart and hands in the small of lower back.  Slowly bend backwards as far as possible without forcing the movement.   This is a movement not a stretch so go as far as possible but stay only 1-2 seconds maximum. 5 times after sitting for 30 min.   https://mcdowell-oliver.com/https://www.youtube.com/watch?v=4syPT8gMDDA&t=55sy Guided Meditation for Pelvic Floor Relaxation  [employer]  [facility] [address], [address] [place], [place] [address] Phone # [phone] Fax [phone]

## 2015-11-04 NOTE — Therapy (Signed)
[facility] [address], [address] [place], [place], [address] Phone: [phone]   Fax:  [phone]  Physical Therapy Treatment  Patient Details  Name: Jessica Lane MRN: [id number] Date of Birth: [birth year] Referring Provider: Dr. Hollice GongLauren Schiff  Encounter Date: 11/04/2015      PT End of Session - 11/04/15 0807    Visit Number 6   Date for PT Re-Evaluation 01/17/16   Authorization Type [employer]-   Authorization - Visit Number 6   Authorization - Number of Visits 20   PT Start Time 0804   PT Stop Time 0843   PT Time Calculation (min) 39 min   Activity Tolerance Patient tolerated treatment well   Behavior During Therapy [facility] for tasks assessed/performed      No past medical history on file.  Past Surgical History  Procedure Laterality Date  . Cesarean section      There were no vitals filed for this visit.      Subjective Assessment - 11/04/15 0807    Subjective I have been having trouble getting my exercises done due to planning my daughters wedding. My pain is 80% better.  I went for a walk and hip hurt  du eto wearing different shoes.    Limitations Sitting   How long can you sit comfortably? several hours   How long can you walk comfortably? walking 95% better.  pain is not at opening instead deeper.    Patient Stated Goals find way to understand exercises to reduce pain   Currently in Pain? No/denies            [facility] PT Assessment - 11/04/15 0001    AROM   Lumbar Flexion full   Lumbar Extension decreased by 50%   Strength   Overall Strength Comments bil. hip abduction 4/5                     [facility] Adult PT Treatment/Exercise - 11/04/15 0001    Lumbar Exercises: Prone   Other Prone Lumbar Exercises hip extension with knee flexed and straight 10times and vc to contract gluteal first   Manual Therapy   Manual Therapy Joint mobilization;Other (comment)   Joint Mobilization anterior glide of  femoral head grade 3 bil.    Other Manual Therapy stretching bilateral quads and hip rotators; bil. iliotibial band                PT Education - 11/04/15 0854    Education provided Yes   Education Details meditation for pelvic floor, iliotibial band stretch, lumbar extension   Person(s) Educated Patient   Methods Explanation;Demonstration;Verbal cues;Handout   Comprehension Returned demonstration;Verbalized understanding          PT Short Term Goals - 10/20/15 1658    PT SHORT TERM GOAL #1   Title Independent with flexibility exericses   Time 4   Period Weeks   Status Achieved   PT SHORT TERM GOAL #2   Title understand diaphragmatic breathing to relax the pelvic floor muscles   Time 4   Period Weeks   Status Achieved   PT SHORT TERM GOAL #3   Title ability to bring her legs apart and use a q-tip to touch the vulvar area with pain no worse than 3/10.    Time 4   Period Weeks   Status Achieved   PT SHORT TERM GOAL #4   Title walk with pain decreased >/= 25% due to improved flexibility of  bil. lower extremities   Time 4   Period Weeks   Status Achieved   PT SHORT TERM GOAL #5   Title understand how to use home TENS unit for pain management   Time 4   Period Weeks   Status Achieved           PT Long Term Goals - 11/04/15 0810    PT LONG TERM GOAL #1   Title independent with HEP and understands how to progress herself  including pain management   Time 4   Period Months   Status On-going   PT LONG TERM GOAL #2   Title ability to walk with pain decreased >/= 75% due to improve flexibility of LE and perineal area   Time 4   Period Months   Status Achieved   PT LONG TERM GOAL #3   Title ability to have a vaginal exam due to the introitus enlarged in diameter with pain decreased </= 3/10   Time 4   Period Months   Status On-going   PT LONG TERM GOAL #4   Title able to sit or stand with 75% decrease in pain    Time 4   Period Months   Status Achieved                Plan - 11/04/15 0855    Clinical Impression Statement Patient reports she is 80% better.  She is able to walk without pain when using correct shoes.  Patient has tight quads and hip rotators.  Patient has learned pelvic meditation to relax pelvis.  Patient will benefit form physical therapy to reduce pain in pelvic floor.    Rehab Potential Excellent   Clinical Impairments Affecting Rehab Potential No pelvic floor contraction due to tightness   PT Frequency 2x / week   PT Duration Other (comment)  4 months   PT Treatment/Interventions ADLs/Self Care Home Management;Biofeedback;Cryotherapy;Electrical Stimulation;Ultrasound;Moist Heat;Functional mobility training;Therapeutic activities;Therapeutic exercise   PT Next Visit Plan soft tissue work, core stabilization   PT Home Exercise Plan progress as needed   Consulted and Agree with Plan of Care Patient      Patient will benefit from skilled therapeutic intervention in order to improve the following deficits and impairments:  Increased fascial restricitons, Pain, Decreased mobility, Increased muscle spasms, Decreased activity tolerance, Decreased endurance, Decreased range of motion, Decreased strength, Hypomobility, Impaired flexibility  Visit Diagnosis: Other muscle spasm     Problem List There are no active problems to display for this patient.   Eulis Foster, PT 11/04/2015 9:00 AM   [facility] [address], [address] [place], [place], [address] Phone: [phone]   Fax:  [phone]  Name: Rachal Dvorsky MRN: [id number] Date of Birth: March 02, 1961

## 2015-11-27 ENCOUNTER — Ambulatory Visit: Payer: Managed Care, Other (non HMO) | Admitting: Physical Therapy

## 2015-12-15 ENCOUNTER — Ambulatory Visit: Payer: Managed Care, Other (non HMO) | Attending: Obstetrics and Gynecology | Admitting: Physical Therapy

## 2015-12-15 ENCOUNTER — Encounter: Payer: Self-pay | Admitting: Physical Therapy

## 2015-12-15 DIAGNOSIS — M62838 Other muscle spasm: Secondary | ICD-10-CM | POA: Diagnosis not present

## 2015-12-15 NOTE — Therapy (Signed)
[facility] [address], [place], [place], [address] Phone: [phone]   Fax:  [phone]  Physical Therapy Treatment  Patient Details  Name: Jessica Lane MRN: [id number] Date of Birth: Mar 12, 1961 Referring Provider: Dr. Yvette Rack  Encounter Date: 12/15/2015      PT End of Session - 12/15/15 0817    Visit Number 7   Date for PT Re-Evaluation 01/17/16   Authorization Type [employer]-   Authorization - Visit Number 7   Authorization - Number of Visits 20   PT Start Time 0800   PT Stop Time 0838   PT Time Calculation (min) 38 min   Activity Tolerance Patient tolerated treatment well   Behavior During Therapy [facility] for tasks assessed/performed      History reviewed. No pertinent past medical history.  Past Surgical History  Procedure Laterality Date  . Cesarean section      There were no vitals filed for this visit.      Subjective Assessment - 12/15/15 0806    Subjective NO hip pain with walking.   I have been playing tennis and no difficulty. i would like stretches with standing I can do in tennis.    How long can you sit comfortably? several hours   How long can you walk comfortably? walking 95% better.  pain is not at opening instead deeper.    Patient Stated Goals find way to understand exercises to reduce pain   Currently in Pain? 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  No   Is the patient reluctant to leave their home because of a fear of falling?  No   Home Ecologist residence   Prior  Function   Level of Independence Independent   Cognition   Overall Cognitive Status Within Functional Limits for tasks assessed   Observation/Other Assessments   Focus on Therapeutic Outcomes (FOTO)  8%   Posture/Postural Control   Posture/Postural Control No significant limitations   AROM   Lumbar Extension decreased by 25%   Strength   Overall Strength Comments bilateral hip abuction 4+/5   Palpation   SI assessment  pelvis in correct alignment                  Pelvic Floor Special Questions - 12/15/15 0001    Marinoff Scale no problems   Pelvic Floor Internal Exam Patient confirms identificaltion and approves PT to assess muscle integrity    Exam Type Vaginal   Palpation NO tendernsss   Strength good squeeze, good lift, able to hold agaisnt strong resistance           [facility] Adult PT Treatment/Exercise - 12/15/15 0001    Exercises   Exercises --  reviewed past exercise program and patient is independent                PT Education - 12/15/15 0826    Education provided Yes   Education Details standing stretches   Person(s)  Educated Patient   Methods Explanation;Demonstration;Verbal cues;Handout   Comprehension Returned demonstration;Verbalized understanding          PT Short Term Goals - 10/20/15 1658    PT SHORT TERM GOAL #1   Title Independent with flexibility exericses   Time 4   Period Weeks   Status Achieved   PT SHORT TERM GOAL #2   Title understand diaphragmatic breathing to relax the pelvic floor muscles   Time 4   Period Weeks   Status Achieved   PT SHORT TERM GOAL #3   Title ability to bring her legs apart and use a q-tip to touch the vulvar area with pain no worse than 3/10.    Time 4   Period Weeks   Status Achieved   PT SHORT TERM GOAL #4   Title walk with pain decreased >/= 25% due to improved flexibility of bil. lower extremities   Time 4   Period Weeks   Status Achieved   PT SHORT TERM GOAL #5   Title understand  how to use home TENS unit for pain management   Time 4   Period Weeks   Status Achieved           PT Long Term Goals - 12/15/15 9983    PT LONG TERM GOAL #1   Title independent with HEP and understands how to progress herself  including pain management   Time 4   Period Months   Status Achieved   PT LONG TERM GOAL #2   Title ability to walk with pain decreased >/= 75% due to improve flexibility of LE and perineal area   Time 4   Period Months   Status Achieved   PT LONG TERM GOAL #3   Title ability to have a vaginal exam due to the introitus enlarged in diameter with pain decreased </= 3/10   Time 4   Period Months   Status Achieved   PT LONG TERM GOAL #4   Title able to sit or stand with 75% decrease in pain    Time 4   Period Months   Status Achieved               Plan - 12/15/15 0827    Clinical Impression Statement Patient has met her goals.  Patient FOTO has decreased from 29% to 8% limitation. No hip pain with tennis or walking.  No pelvic pain with intercourse.  Pelvis floor strength has improved from 3/5 to 4/5.  Bilateral hip abduction  strength has gone from 4/5 to 4+/5.  Patient is ready for dishcarge.    Rehab Potential Excellent   Clinical Impairments Affecting Rehab Potential No pelvic floor contraction due to tightness   PT Frequency 2x / week   PT Duration Other (comment)  4 months   PT Treatment/Interventions ADLs/Self Care Home Management;Biofeedback;Cryotherapy;Electrical Stimulation;Ultrasound;Moist Heat;Functional mobility training;Therapeutic activities;Therapeutic exercise   PT Next Visit Plan Discharge to HEP   PT Home Exercise Plan Current HEP   Consulted and Agree with Plan of Care Patient      Patient will benefit from skilled therapeutic intervention in order to improve the following deficits and impairments:  Increased fascial restricitons, Pain, Decreased mobility, Increased muscle spasms, Decreased activity tolerance, Decreased  endurance, Decreased range of motion, Decreased strength, Hypomobility, Impaired flexibility  Visit Diagnosis: Other muscle spasm     Problem List There are no active problems to display for this patient.   Earlie Counts, PT 12/15/2015 8:35 AM  [facility] [address] [place], [place], [address] Phone: [phone]   Fax:  [phone]  Name: Jessica Lane MRN: [id number] Date of Birth: 1961-06-28  PHYSICAL THERAPY DISCHARGE SUMMARY  Visits from Start of Care: 7  Current functional level related to goals / functional outcomes: See above.    Remaining deficits: See above   Education / Equipment: HEP Plan: Patient agrees to discharge.  Patient goals were met. Patient is being discharged due to meeting the stated rehab goals. Thank you for the referral. Earlie Counts, PT 12/15/2015 8:35 AM   ?????

## 2015-12-15 NOTE — Patient Instructions (Signed)
Iliotibial Band Stretch, Standing    Stand, one leg crossed behind other leg. Bending at waist, reach toward back foot. Hold _30__ seconds.  Repeat _2__ times per session. Do __1_ sessions per day.  Copyright  VHI. All rights reserved.  Piriformis Stretch, Standing: Yoga Crossed Leg Chair    Stand on one leg, other leg crossed over thigh of standing leg. Bend standing leg, sitting back as if in chair. Reach arms down sides. Hold _15_ seconds.  Repeat _2__ times per session. Do __2_ sessions per day. Hold onto something.  Copyright  VHI. All rights reserved.  Quads / HF, Standing    Stand, holding onto chair and grasping one foot with same-side hand. Pull heel toward buttock. Hold _30__ seconds.  Repeat _2__ times per session. Do 1___ sessions per day. Stand straight and squeeze buttocks.  Copyright  VHI. All rights reserved.   Groin Stretch    Hands on hips, back straight, feet parallel and 4"-6" greater than shoulder width apart. Slowly shift weight to one side, keeping bent knee directly over foot. Hold __30__ seconds. Repeat to other side.  Copyright  VHI. All rights reserved.  [facility] [address], [address] [place], [place] [address] Phone # [phone] Fax [phone]

## 2017-10-11 ENCOUNTER — Encounter: Payer: Self-pay | Admitting: Family Medicine

## 2017-10-11 ENCOUNTER — Ambulatory Visit (INDEPENDENT_AMBULATORY_CARE_PROVIDER_SITE_OTHER): Payer: 59 | Admitting: Family Medicine

## 2017-10-11 ENCOUNTER — Ambulatory Visit (INDEPENDENT_AMBULATORY_CARE_PROVIDER_SITE_OTHER): Payer: 59

## 2017-10-11 VITALS — BP 144/100 | HR 96 | Temp 99.1°F | Ht 64.0 in | Wt 208.6 lb

## 2017-10-11 DIAGNOSIS — S161XXA Strain of muscle, fascia and tendon at neck level, initial encounter: Secondary | ICD-10-CM | POA: Diagnosis not present

## 2017-10-11 DIAGNOSIS — R030 Elevated blood-pressure reading, without diagnosis of hypertension: Secondary | ICD-10-CM

## 2017-10-11 MED ORDER — METHOCARBAMOL 500 MG PO TABS: 500.0000 mg | ORAL_TABLET | Freq: Four times a day (QID) | ORAL | 0 refills | Status: DC | PRN

## 2017-10-11 MED ORDER — MELOXICAM 15 MG PO TABS: 15.0000 mg | ORAL_TABLET | Freq: Every day | ORAL | 0 refills | Status: DC

## 2017-10-11 NOTE — Patient Instructions (Addendum)
Cervical Sprain A cervical sprain is a stretch or tear in one or more of the tough, cord-like tissues that connect bones (ligaments) in the neck. Cervical sprains can range from mild to severe. Severe cervical sprains can cause the spinal bones (vertebrae) in the neck to be unstable. This can lead to spinal cord damage and can result in serious nervous system problems. The amount of time that it takes for a cervical sprain to get better depends on the cause and extent of the injury. Most cervical sprains heal in 4-6 weeks. What are the causes? Cervical sprains may be caused by an injury (trauma), such as from a motor vehicle accident, a fall, or sudden forward and backward whipping movement of the head and neck (whiplash injury). Mild cervical sprains may be caused by wear and tear over time, such as from poor posture, sitting in a chair that does not provide support, or looking up or down for long periods of time. What increases the risk? The following factors may make you more likely to develop this condition:  Participating in activities that have a high risk of trauma to the neck. These include contact sports, auto racing, gymnastics, and diving.  Taking risks when driving or riding in a motor vehicle, such as speeding.  Having osteoarthritis of the spine.  Having poor strength and flexibility of the neck.  A previous neck injury.  Having poor posture.  Spending a lot of time in certain positions that put stress on the neck, such as sitting at a computer for long periods of time.  What are the signs or symptoms? Symptoms of this condition include:  Pain, soreness, stiffness, tenderness, swelling, or a burning sensation in the front, back, or sides of the neck.  Sudden tightening of neck muscles that you cannot control (muscle spasms).  Pain in the shoulders or upper back.  Limited ability to move the neck.  Headache.  Dizziness.  Nausea.  Vomiting.  Weakness, numbness,  or tingling in a hand or an arm.  Symptoms may develop right away after injury, or they may develop over a few days. In some cases, symptoms may go away with treatment and return (recur) over time. How is this diagnosed? This condition may be diagnosed based on:  Your medical history.  Your symptoms.  Any recent injuries or known neck problems that you have, such as arthritis in the neck.  A physical exam.  Imaging tests, such as: ? X-rays. ? MRI. ? CT scan.  How is this treated? This condition is treated by resting and icing the injured area and doing physical therapy exercises. Depending on the severity of your condition, treatment may also include:  Keeping your neck in place (immobilized) for periods of time. This may be done using: ? A cervical collar. This supports your chin and the back of your head. ? A cervical traction device. This is a sling that holds up your head. This removes weight and pressure from your neck, and it may help to relieve pain.  Medicines that help to relieve pain and inflammation.  Medicines that help to relax your muscles (muscle relaxants).  Surgery. This is rare.  Follow these instructions at home: If you have a cervical collar:  Wear it as told by your health care provider. Do not remove the collar unless instructed by your health care provider.  Ask your health care provider before you make any adjustments to your collar.  If you have long hair, keep it outside  of the collar.  Ask your health care provider if you can remove the collar for cleaning and bathing. If you are allowed to remove the collar for cleaning or bathing: ? Follow instructions from your health care provider about how to remove the collar safely. ? Clean the collar by wiping it with mild soap and water and drying it completely. ? If your collar has removable pads, remove them every 1-2 days and wash them by hand with soap and water. Let them air-dry completely before  you put them back in the collar. ? Check your skin under the collar for irritation or sores. If you see any, tell your health care provider. Managing pain, stiffness, and swelling  If directed, use a cervical traction device as told by your health care provider.  If directed, apply heat to the affected area before you do your physical therapy or as often as told by your health care provider. Use the heat source that your health care provider recommends, such as a moist heat pack or a heating pad. ? Place a towel between your skin and the heat source. ? Leave the heat on for 20-30 minutes. ? Remove the heat if your skin turns bright red. This is especially important if you are unable to feel pain, heat, or cold. You may have a greater risk of getting burned.  If directed, put ice on the affected area: ? Put ice in a plastic bag. ? Place a towel between your skin and the bag. ? Leave the ice on for 20 minutes, 2-3 times a day. Activity  Do not drive while wearing a cervical collar. If you do not have a cervical collar, ask your health care provider if it is safe to drive while your neck heals.  Do not drive or use heavy machinery while taking prescription pain medicine or muscle relaxants, unless your health care provider approves.  Do not lift anything that is heavier than 10 lb (4.5 kg) until your health care provider tells you that it is safe.  Rest as directed by your health care provider. Avoid positions and activities that make your symptoms worse. Ask your health care provider what activities are safe for you.  If physical therapy was prescribed, do exercises as told by your health care provider or physical therapist. General instructions  Take over-the-counter and prescription medicines only as told by your health care provider.  Do not use any products that contain nicotine or tobacco, such as cigarettes and e-cigarettes. These can delay healing. If you need help quitting, ask  your health care provider.  Keep all follow-up visits as told by your health care provider or physical therapist. This is important. How is this prevented? To prevent a cervical sprain from happening again:  Use and maintain good posture. Make any needed adjustments to your workstation to help you use good posture.  Exercise regularly as directed by your health care provider or physical therapist.  Avoid risky activities that may cause a cervical sprain.  Contact a health care provider if:  You have symptoms that get worse or do not get better after 2 weeks of treatment.  You have pain that gets worse or does not get better with medicine.  You develop new, unexplained symptoms.  You have sores or irritated skin on your neck from wearing your cervical collar. Get help right away if:  You have severe pain.  You develop numbness, tingling, or weakness in any part of your body.  You  cannot move a part of your body (you have paralysis).  You have neck pain along with: ? Severe dizziness. ? Headache. Summary  A cervical sprain is a stretch or tear in one or more of the tough, cord-like tissues that connect bones (ligaments) in the neck.  Cervical sprains may be caused by an injury (trauma), such as from a motor vehicle accident, a fall, or sudden forward and backward whipping movement of the head and neck (whiplash injury).  Symptoms may develop right away after injury, or they may develop over a few days.  This condition is treated by resting and icing the injured area and doing physical therapy exercises. This information is not intended to replace advice given to you by your health care provider. Make sure you discuss any questions you have with your health care provider. Document Released: 04/25/2007 Document Revised: 02/25/2016 Document Reviewed: 02/25/2016 Elsevier Interactive Patient Education  2018 [place].  Cervical Strain and Sprain Rehab Ask your health care  provider which exercises are safe for you. Do exercises exactly as told by your health care provider and adjust them as directed. It is normal to feel mild stretching, pulling, tightness, or discomfort as you do these exercises, but you should stop right away if you feel sudden pain or your pain gets worse.Do not begin these exercises until told by your health care provider. Stretching and range of motion exercises These exercises warm up your muscles and joints and improve the movement and flexibility of your neck. These exercises also help to relieve pain, numbness, and tingling. Exercise A: Cervical side bend  1. Using good posture, sit on a stable chair or stand up. 2. Without moving your shoulders, slowly tilt your left / right ear to your shoulder until you feel a stretch in your neck muscles. You should be looking straight ahead. 3. Hold for __________ seconds. 4. Repeat with the other side of your neck. Repeat __________ times. Complete this exercise __________ times a day. Exercise B: Cervical rotation  1. Using good posture, sit on a stable chair or stand up. 2. Slowly turn your head to the side as if you are looking over your left / right shoulder. ? Keep your eyes level with the ground. ? Stop when you feel a stretch along the side and the back of your neck. 3. Hold for __________ seconds. 4. Repeat this by turning to your other side. Repeat __________ times. Complete this exercise __________ times a day. Exercise C: Thoracic extension and pectoral stretch 1. Roll a towel or a small blanket so it is about 4 inches (10 cm) in diameter. 2. Lie down on your back on a firm surface. 3. Put the towel lengthwise, under your spine in the middle of your back. It should not be not under your shoulder blades. The towel should line up with your spine from your middle back to your lower back. 4. Put your hands behind your head and let your elbows fall out to your sides. 5. Hold for __________  seconds. Repeat __________ times. Complete this exercise __________ times a day. Strengthening exercises These exercises build strength and endurance in your neck. Endurance is the ability to use your muscles for a long time, even after your muscles get tired. Exercise D: Upper cervical flexion, isometric 1. Lie on your back with a thin pillow behind your head and a small rolled-up towel under your neck. 2. Gently tuck your chin toward your chest and nod your head down to  look toward your feet. Do not lift your head off the pillow. 3. Hold for __________ seconds. 4. Release the tension slowly. Relax your neck muscles completely before you repeat this exercise. Repeat __________ times. Complete this exercise __________ times a day. Exercise E: Cervical extension, isometric  1. Stand about 6 inches (15 cm) away from a wall, with your back facing the wall. 2. Place a soft object, about 6-8 inches (15-20 cm) in diameter, between the back of your head and the wall. A soft object could be a small pillow, a ball, or a folded towel. 3. Gently tilt your head back and press into the soft object. Keep your jaw and forehead relaxed. 4. Hold for __________ seconds. 5. Release the tension slowly. Relax your neck muscles completely before you repeat this exercise. Repeat __________ times. Complete this exercise __________ times a day. Posture and body mechanics  Body mechanics refers to the movements and positions of your body while you do your daily activities. Posture is part of body mechanics. Good posture and healthy body mechanics can help to relieve stress in your body's tissues and joints. Good posture means that your spine is in its natural S-curve position (your spine is neutral), your shoulders are pulled back slightly, and your head is not tipped forward. The following are general guidelines for applying improved posture and body mechanics to your everyday activities. Standing  When standing, keep  your spine neutral and keep your feet about hip-width apart. Keep a slight bend in your knees. Your ears, shoulders, and hips should line up.  When you do a task in which you stand in one place for a long time, place one foot up on a stable object that is 2-4 inches (5-10 cm) high, such as a footstool. This helps keep your spine neutral. Sitting   When sitting, keep your spine neutral and your keep feet flat on the floor. Use a footrest, if necessary, and keep your thighs parallel to the floor. Avoid rounding your shoulders, and avoid tilting your head forward.  When working at a desk or a computer, keep your desk at a height where your hands are slightly lower than your elbows. Slide your chair under your desk so you are close enough to maintain good posture.  When working at a computer, place your monitor at a height where you are looking straight ahead and you do not have to tilt your head forward or downward to look at the screen. Resting When lying down and resting, avoid positions that are most painful for you. Try to support your neck in a neutral position. You can use a contour pillow or a small rolled-up towel. Your pillow should support your neck but not push on it. This information is not intended to replace advice given to you by your health care provider. Make sure you discuss any questions you have with your health care provider. Document Released: 06/28/2005 Document Revised: 03/04/2016 Document Reviewed: 06/04/2015 Elsevier Interactive Patient Education  2018 [employer].  Preventing Hypertension Hypertension, commonly called high blood pressure, is when the force of blood pumping through the arteries is too strong. Arteries are blood vessels that carry blood from the heart throughout the body. Over time, hypertension can damage the arteries and decrease blood flow to important parts of the body, including the brain, heart, and kidneys. Often, hypertension does not cause symptoms  until blood pressure is very high. For this reason, it is important to have your blood pressure checked on  a regular basis. Hypertension can often be prevented with diet and lifestyle changes. If you already have hypertension, you can control it with diet and lifestyle changes, as well as medicine. What nutrition changes can be made? Maintain a healthy diet. This includes:  Eating less salt (sodium). Ask your health care provider how much sodium is safe for you to have. The general recommendation is to consume less than 1 tsp (2,300 mg) of sodium a day. ? Do not add salt to your food. ? Choose low-sodium options when grocery shopping and eating out.  Limiting fats in your diet. You can do this by eating low-fat or fat-free dairy products and by eating less red meat.  Eating more fruits, vegetables, and whole grains. Make a goal to eat: ? 1-2 cups of fresh fruits and vegetables each day. ? 3-4 servings of whole grains each day.  Avoiding foods and beverages that have added sugars.  Eating fish that contain healthy fats (omega-3 fatty acids), such as mackerel or salmon.  If you need help putting together a healthy eating plan, try the DASH diet. This diet is high in fruits, vegetables, and whole grains. It is low in sodium, red meat, and added sugars. DASH stands for Dietary Approaches to Stop Hypertension. What lifestyle changes can be made?  Lose weight if you are overweight. Losing just 3?5% of your body weight can help prevent or control hypertension. ? For example, if your present weight is 200 lb (91 kg), a loss of 3-5% of your weight means losing 6-10 lb (2.7-4.5 kg). ? Ask your health care provider to help you with a diet and exercise plan to safely lose weight.  Get enough exercise. Do at least 150 minutes of moderate-intensity exercise each week. ? You could do this in short exercise sessions several times a day, or you could do longer exercise sessions a few times a week. For  example, you could take a brisk 10-minute walk or bike ride, 3 times a day, for 5 days a week.  Find ways to reduce stress, such as exercising, meditating, listening to music, or taking a yoga class. If you need help reducing stress, ask your health care provider.  Do not smoke. This includes e-cigarettes. Chemicals in tobacco and nicotine products raise your blood pressure each time you smoke. If you need help quitting, ask your health care provider.  Avoid alcohol. If you drink alcohol, limit alcohol intake to no more than 1 drink a day for nonpregnant women and 2 drinks a day for men. One drink equals 12 oz of beer, 5 oz of wine, or 1 oz of hard liquor. Why are these changes important? Diet and lifestyle changes can help you prevent hypertension, and they may make you feel better overall and improve your quality of life. If you have hypertension, making these changes will help you control it and help prevent major complications, such as:  Hardening and narrowing of arteries that supply blood to: ? Your heart. This can cause a heart attack. ? Your brain. This can cause a stroke. ? Your kidneys. This can cause kidney failure.  Stress on your heart muscle, which can cause heart failure.  What can I do to lower my risk?  Work with your health care provider to make a hypertension prevention plan that works for you. Follow your plan and keep all follow-up visits as told by your health care provider.  Learn how to check your blood pressure at home. Make sure that  you know your personal target blood pressure, as told by your health care provider. How is this treated? In addition to diet and lifestyle changes, your health care provider may recommend medicines to help lower your blood pressure. You may need to try a few different medicines to find what works best for you. You also may need to take more than one medicine. Take over-the-counter and prescription medicines only as told by your health  care provider. Where to find support: Your health care provider can help you prevent hypertension and help you keep your blood pressure at a healthy level. Your local hospital or your community may also provide support services and prevention programs. The American Heart Association offers an online support network at: https://www.lee.net/ Where to find more information: Learn more about hypertension from:  National Heart, Lung, and Blood Institute: https://www.peterson.org/  Centers for Disease Control and Prevention: AboutHD.co.nz  American Academy of Family Physicians: http://familydoctor.org/familydoctor/en/diseases-conditions/high-blood-pressure.printerview.all.html  Learn more about the DASH diet from:  National Heart, Lung, and Blood Institute: WedMap.it  Contact a health care provider if:  You think you are having a reaction to medicines you have taken.  You have recurrent headaches or feel dizzy.  You have swelling in your ankles.  You have trouble with your vision. Summary  Hypertension often does not cause any symptoms until blood pressure is very high. It is important to get your blood pressure checked regularly.  Diet and lifestyle changes are the most important steps in preventing hypertension.  By keeping your blood pressure in a healthy range, you can prevent complications like heart attack, heart failure, stroke, and kidney failure.  Work with your health care provider to make a hypertension prevention plan that works for you. This information is not intended to replace advice given to you by your health care provider. Make sure you discuss any questions you have with your health care provider. Document Released: 07/13/2015 Document Revised: 03/08/2016 Document Reviewed: 03/08/2016 Elsevier Interactive Patient Education  [employer].
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
## 2019-11-28 ENCOUNTER — Telehealth (INDEPENDENT_AMBULATORY_CARE_PROVIDER_SITE_OTHER): Payer: 59 | Admitting: Family Medicine

## 2019-11-28 ENCOUNTER — Encounter: Payer: Self-pay | Admitting: Family Medicine

## 2019-11-28 VITALS — Temp 97.6°F | Ht 64.0 in | Wt 190.0 lb

## 2019-11-28 DIAGNOSIS — J019 Acute sinusitis, unspecified: Secondary | ICD-10-CM

## 2019-11-28 MED ORDER — PREDNISONE 20 MG PO TABS: 40.0000 mg | ORAL_TABLET | Freq: Every day | ORAL | 0 refills | Status: AC

## 2019-11-28 MED ORDER — AMOXICILLIN-POT CLAVULANATE 875-125 MG PO TABS: 1.0000 | ORAL_TABLET | Freq: Two times a day (BID) | ORAL | 0 refills | Status: AC

## 2019-11-28 NOTE — Progress Notes (Signed)
Virtual Visit via Video Note  I connected with [name] on 11/28/19 at  3:00 PM EDT by a video enabled telemedicine application and verified that I am speaking with the correct person using two identifiers. Location patient: home Location provider: work  Persons participating in the virtual visit: patient, provider  I discussed the limitations of evaluation and management by telemedicine and the availability of in person appointments. The patient expressed understanding and agreed to proceed.  Chief Complaint  Patient presents with  . Sinusitis    C/O headaches, chest and nasal congestion green drainage x 3 weeks OTC medications not helping Advil will help some     HPI: Jessica Lane is a 59 y.o. female who complains of 3 week h/o nasal congestion, runny nose, headache, PND. Symptoms mainly on Rt side. Low grade fever initially but not recently.  Pt has tried multiple OTC meds w/o relief. Advil provides the most relief.    Past Medical History:  Diagnosis Date  . GERD (gastroesophageal reflux disease)   . History of chicken pox   . Myofascial pain dysfunction syndrome     Past Surgical History:  Procedure Laterality Date  . CESAREAN SECTION     1988 & 1991    Family History  Problem Relation Age of Onset  . Diabetes Mother   . Kidney disease Mother   . Arthritis Father   . Cancer Father        Prostate  . Diabetes Father   . Heart attack Father   . Depression Sister   . Coronary artery disease Maternal Grandmother   . Heart disease Maternal Grandfather   . Suicidality Paternal Grandfather        58    Social History   Tobacco Use  . Smoking status: Current Every Day Smoker  . Smokeless tobacco: Never Used  Substance Use Topics  . Alcohol use: Yes    Comment: Occas.  . Drug use: Never     Current Outpatient Medications:  .  albuterol (VENTOLIN HFA) 108 (90 Base) MCG/ACT inhaler, Inhale into the lungs., Disp: , Rfl:  .  meloxicam (MOBIC) 15 MG tablet,  Take 1 tablet (15 mg total) by mouth daily. For 10 days and then as needed. (Patient not taking: Reported on 11/28/2019), Disp: 30 tablet, Rfl: 0 .  methocarbamol (ROBAXIN) 500 MG tablet, Take 1 tablet (500 mg total) by mouth every 6 (six) hours as needed for muscle spasms. (Patient not taking: Reported on 11/28/2019), Disp: 60 tablet, Rfl: 0  No Known Allergies    ROS: See pertinent positives and negatives per HPI.   EXAM:  VITALS per patient if applicable: Temp 29.9 F (36.4 C) (Tympanic) Comment: per pt 2 days ago  Ht 5\' 4"  (1.626 m)   Wt 190 lb (86.2 kg) Comment: per pt  LMP 07/31/2010   BMI 32.61 kg/m    GENERAL: alert, oriented, appears well and in no acute distress  NECK: normal movements of the head and neck  LUNGS: on inspection no signs of respiratory distress, breathing rate appears normal, no obvious gross SOB, gasping or wheezing, no conversational dyspnea  CV: no obvious cyanosis  PSYCH/NEURO: pleasant and cooperative, no obvious depression or anxiety, speech and thought processing grossly intact   ASSESSMENT AND PLAN: 1. Acute non-recurrent sinusitis, unspecified location - cont with nasal saline spray, humidifier - add mucinex BID Rx: - predniSONE (DELTASONE) 20 MG tablet; Take 2 tablets (40 mg total) by mouth daily with breakfast for 5  days.  Dispense: 10 tablet; Refill: 0 - amoxicillin-clavulanate (AUGMENTIN) 875-125 MG tablet; Take 1 tablet by mouth 2 (two) times daily for 10 days.  Dispense: 20 tablet; Refill: 0 - f/u if symptoms worsen or do not improve in 7-10 days Discussed plan and reviewed medications with patient, including risks, benefits, and potential side effects. Pt expressed understand. All questions answered.     I discussed the assessment and treatment plan with the patient. The patient was provided an opportunity to ask questions and all were answered. The patient agreed with the plan and demonstrated an understanding of the  instructions.   The patient was advised to call back or seek an in-person evaluation if the symptoms worsen or if the condition fails to improve as anticipated.   [name], DO

## 2023-03-02 ENCOUNTER — Emergency Department (HOSPITAL_COMMUNITY): Payer: 59 | Admitting: Anesthesiology

## 2023-03-02 ENCOUNTER — Encounter (HOSPITAL_COMMUNITY): Admission: EM | Disposition: A | Discharge status: Home / Self Care | Payer: Self-pay | Source: Home / Self Care

## 2023-03-02 ENCOUNTER — Emergency Department (HOSPITAL_COMMUNITY): Payer: 59

## 2023-03-02 ENCOUNTER — Inpatient Hospital Stay (HOSPITAL_COMMUNITY)
Admission: EM | Admit: 2023-03-02 | Discharge: 2023-03-04 | DRG: 493 | Disposition: A | Discharge status: Home / Self Care | Payer: 59 | Attending: Surgery | Admitting: Surgery

## 2023-03-02 ENCOUNTER — Encounter (HOSPITAL_COMMUNITY): Payer: Self-pay

## 2023-03-02 ENCOUNTER — Observation Stay (HOSPITAL_COMMUNITY): Payer: 59

## 2023-03-02 ENCOUNTER — Other Ambulatory Visit: Payer: Self-pay

## 2023-03-02 ENCOUNTER — Emergency Department (HOSPITAL_BASED_OUTPATIENT_CLINIC_OR_DEPARTMENT_OTHER): Payer: 59 | Admitting: Anesthesiology

## 2023-03-02 DIAGNOSIS — Z833 Family history of diabetes mellitus: Secondary | ICD-10-CM

## 2023-03-02 DIAGNOSIS — Y92410 Unspecified street and highway as the place of occurrence of the external cause: Secondary | ICD-10-CM

## 2023-03-02 DIAGNOSIS — S82891A Other fracture of right lower leg, initial encounter for closed fracture: Secondary | ICD-10-CM

## 2023-03-02 DIAGNOSIS — D62 Acute posthemorrhagic anemia: Secondary | ICD-10-CM | POA: Diagnosis not present

## 2023-03-02 DIAGNOSIS — Z791 Long term (current) use of non-steroidal anti-inflammatories (NSAID): Secondary | ICD-10-CM

## 2023-03-02 DIAGNOSIS — E876 Hypokalemia: Secondary | ICD-10-CM | POA: Diagnosis not present

## 2023-03-02 DIAGNOSIS — F17210 Nicotine dependence, cigarettes, uncomplicated: Secondary | ICD-10-CM | POA: Diagnosis present

## 2023-03-02 DIAGNOSIS — K219 Gastro-esophageal reflux disease without esophagitis: Secondary | ICD-10-CM | POA: Diagnosis present

## 2023-03-02 DIAGNOSIS — Z818 Family history of other mental and behavioral disorders: Secondary | ICD-10-CM

## 2023-03-02 DIAGNOSIS — Z23 Encounter for immunization: Secondary | ICD-10-CM

## 2023-03-02 DIAGNOSIS — S2220XA Unspecified fracture of sternum, initial encounter for closed fracture: Secondary | ICD-10-CM | POA: Diagnosis present

## 2023-03-02 DIAGNOSIS — E279 Disorder of adrenal gland, unspecified: Secondary | ICD-10-CM | POA: Diagnosis present

## 2023-03-02 DIAGNOSIS — S2241XA Multiple fractures of ribs, right side, initial encounter for closed fracture: Secondary | ICD-10-CM | POA: Diagnosis present

## 2023-03-02 DIAGNOSIS — Z8261 Family history of arthritis: Secondary | ICD-10-CM

## 2023-03-02 DIAGNOSIS — Z841 Family history of disorders of kidney and ureter: Secondary | ICD-10-CM

## 2023-03-02 DIAGNOSIS — Z8249 Family history of ischemic heart disease and other diseases of the circulatory system: Secondary | ICD-10-CM

## 2023-03-02 DIAGNOSIS — R7401 Elevation of levels of liver transaminase levels: Secondary | ICD-10-CM | POA: Diagnosis present

## 2023-03-02 DIAGNOSIS — S82851C Displaced trimalleolar fracture of right lower leg, initial encounter for open fracture type IIIA, IIIB, or IIIC: Principal | ICD-10-CM | POA: Diagnosis present

## 2023-03-02 DIAGNOSIS — Z79899 Other long term (current) drug therapy: Secondary | ICD-10-CM

## 2023-03-02 HISTORY — PX: ORIF ANKLE FRACTURE: SHX5408

## 2023-03-02 LAB — COMPREHENSIVE METABOLIC PANEL
ALT: 51 U/L — ABNORMAL HIGH (ref 0–44)
AST: 50 U/L — ABNORMAL HIGH (ref 15–41)
Albumin: 3.7 g/dL (ref 3.5–5.0)
Alkaline Phosphatase: 93 U/L (ref 38–126)
Anion gap: 11 (ref 5–15)
BUN: 14 mg/dL (ref 6–20)
CO2: 21 mmol/L — ABNORMAL LOW (ref 22–32)
Calcium: 8.9 mg/dL (ref 8.9–10.3)
Chloride: 109 mmol/L (ref 98–111)
Creatinine, Ser: 0.75 mg/dL (ref 0.44–1.00)
GFR, Estimated: >60 mL/min (ref >60)
Glucose, Bld: 128 mg/dL — ABNORMAL HIGH (ref 70–99)
Potassium: 3.7 mmol/L (ref 3.5–5.1)
Sodium: 141 mmol/L (ref 135–145)
Total Bilirubin: 0.6 mg/dL (ref 0.3–1.2)
Total Protein: 6.7 g/dL (ref 6.5–8.1)

## 2023-03-02 LAB — CBC
HCT: 47.3 % — ABNORMAL HIGH (ref 36.0–46.0)
Hemoglobin: 15.5 g/dL — ABNORMAL HIGH (ref 12.0–15.0)
MCH: 30.5 pg (ref 26.0–34.0)
MCHC: 32.8 g/dL (ref 30.0–36.0)
MCV: 92.9 fL (ref 80.0–100.0)
Platelets: 398 10*3/uL (ref 150–400)
RBC: 5.09 MIL/uL (ref 3.87–5.11)
RDW: 12.8 % (ref 11.5–15.5)
WBC: 15.9 10*3/uL — ABNORMAL HIGH (ref 4.0–10.5)
nRBC: 0 % (ref 0.0–0.2)

## 2023-03-02 LAB — I-STAT CG4 LACTIC ACID, ED: Lactic Acid, Venous: 2.7 mmol/L (ref 0.5–1.9)

## 2023-03-02 LAB — I-STAT CHEM 8, ED
BUN: 17 mg/dL (ref 6–20)
Calcium, Ion: 0.86 mmol/L — CL (ref 1.15–1.40)
Chloride: 114 mmol/L — ABNORMAL HIGH (ref 98–111)
Creatinine, Ser: 0.7 mg/dL (ref 0.44–1.00)
Glucose, Bld: 124 mg/dL — ABNORMAL HIGH (ref 70–99)
HCT: 46 % (ref 36.0–46.0)
Hemoglobin: 15.6 g/dL — ABNORMAL HIGH (ref 12.0–15.0)
Potassium: 3.9 mmol/L (ref 3.5–5.1)
Sodium: 140 mmol/L (ref 135–145)
TCO2: 19 mmol/L — ABNORMAL LOW (ref 22–32)

## 2023-03-02 LAB — SURGICAL PCR SCREEN
MRSA, PCR: NEGATIVE
Staphylococcus aureus: NEGATIVE

## 2023-03-02 LAB — PROTIME-INR
INR: 0.9 (ref 0.8–1.2)
Prothrombin Time: 12.8 seconds (ref 11.4–15.2)

## 2023-03-02 LAB — ETHANOL: Alcohol, Ethyl (B): <10 mg/dL (ref <10)

## 2023-03-02 LAB — HCG, SERUM, QUALITATIVE: Preg, Serum: NEGATIVE

## 2023-03-02 LAB — SAMPLE TO BLOOD BANK

## 2023-03-02 LAB — HIV ANTIBODY (ROUTINE TESTING W REFLEX): HIV Screen 4th Generation wRfx: NONREACTIVE

## 2023-03-02 LAB — VITAMIN D 25 HYDROXY (VIT D DEFICIENCY, FRACTURES): Vit D, 25-Hydroxy: 9.5 ng/mL — ABNORMAL LOW (ref 30–100)

## 2023-03-02 SURGERY — OPEN REDUCTION INTERNAL FIXATION (ORIF) ANKLE FRACTURE
Anesthesia: General | Site: Ankle | Laterality: Right

## 2023-03-02 MED ORDER — ONDANSETRON HCL 4 MG/2ML IJ SOLN
INTRAMUSCULAR | Status: DC | PRN
  Administered 2023-03-02: 4 mg via INTRAVENOUS

## 2023-03-02 MED ORDER — ONDANSETRON HCL 4 MG/2ML IJ SOLN
INTRAMUSCULAR | Status: AC
  Filled 2023-03-02: qty 6

## 2023-03-02 MED ORDER — CHLORHEXIDINE GLUCONATE 0.12 % MT SOLN
OROMUCOSAL | Status: AC
  Administered 2023-03-02: 15 mL via OROMUCOSAL
  Filled 2023-03-02: qty 15

## 2023-03-02 MED ORDER — PNEUMOCOCCAL 20-VAL CONJ VACC 0.5 ML IM SUSY
0.5000 mL | PREFILLED_SYRINGE | INTRAMUSCULAR | Status: AC
  Administered 2023-03-04: 0.5 mL via INTRAMUSCULAR
  Filled 2023-03-02 (×2): qty 0.5

## 2023-03-02 MED ORDER — LACTATED RINGERS IV SOLN: INTRAVENOUS | Status: DC

## 2023-03-02 MED ORDER — VANCOMYCIN HCL 1000 MG IV SOLR
INTRAVENOUS | Status: AC
  Filled 2023-03-02: qty 20

## 2023-03-02 MED ORDER — FENTANYL CITRATE PF 50 MCG/ML IJ SOSY: 50.0000 ug | PREFILLED_SYRINGE | Freq: Once | INTRAMUSCULAR | Status: DC

## 2023-03-02 MED ORDER — DOCUSATE SODIUM 100 MG PO CAPS
100.0000 mg | ORAL_CAPSULE | Freq: Two times a day (BID) | ORAL | Status: DC
  Administered 2023-03-02 – 2023-03-03 (×3): 100 mg via ORAL
  Filled 2023-03-02 (×4): qty 1

## 2023-03-02 MED ORDER — PHENYLEPHRINE HCL-NACL 20-0.9 MG/250ML-% IV SOLN
INTRAVENOUS | Status: DC | PRN
  Administered 2023-03-02: 75 ug/min via INTRAVENOUS

## 2023-03-02 MED ORDER — PROPOFOL 10 MG/ML IV BOLUS
INTRAVENOUS | Status: AC
  Filled 2023-03-02: qty 20

## 2023-03-02 MED ORDER — PHENYLEPHRINE 80 MCG/ML (10ML) SYRINGE FOR IV PUSH (FOR BLOOD PRESSURE SUPPORT)
PREFILLED_SYRINGE | INTRAVENOUS | Status: DC | PRN
  Administered 2023-03-02 (×3): 160 ug via INTRAVENOUS
  Administered 2023-03-02: 80 ug via INTRAVENOUS

## 2023-03-02 MED ORDER — ROPIVACAINE HCL 5 MG/ML IJ SOLN
INTRAMUSCULAR | Status: DC | PRN
  Administered 2023-03-02: 20 mL via PERINEURAL

## 2023-03-02 MED ORDER — POLYETHYLENE GLYCOL 3350 17 G PO PACK: 17.0000 g | PACK | Freq: Every day | ORAL | Status: DC | PRN

## 2023-03-02 MED ORDER — EPHEDRINE 5 MG/ML INJ
INTRAVENOUS | Status: AC
  Filled 2023-03-02: qty 5

## 2023-03-02 MED ORDER — MIDAZOLAM HCL 2 MG/2ML IJ SOLN: 2.0000 mg | Freq: Once | INTRAMUSCULAR | Status: AC

## 2023-03-02 MED ORDER — SODIUM CHLORIDE 0.9 % IV SOLN
2.0000 g | INTRAVENOUS | Status: DC
  Administered 2023-03-02 – 2023-03-03 (×2): 2 g via INTRAVENOUS
  Filled 2023-03-02 (×2): qty 20

## 2023-03-02 MED ORDER — SODIUM CHLORIDE 0.9 % IR SOLN
Status: DC | PRN
Start: 2023-03-02 — End: 2023-03-02
  Administered 2023-03-02: 3000 mL

## 2023-03-02 MED ORDER — ENOXAPARIN SODIUM 30 MG/0.3ML IJ SOSY
30.0000 mg | PREFILLED_SYRINGE | Freq: Two times a day (BID) | INTRAMUSCULAR | Status: DC
  Administered 2023-03-03 – 2023-03-04 (×3): 30 mg via SUBCUTANEOUS
  Filled 2023-03-02 (×3): qty 0.3

## 2023-03-02 MED ORDER — PHENYLEPHRINE 80 MCG/ML (10ML) SYRINGE FOR IV PUSH (FOR BLOOD PRESSURE SUPPORT)
PREFILLED_SYRINGE | INTRAVENOUS | Status: AC
  Filled 2023-03-02: qty 10

## 2023-03-02 MED ORDER — FENTANYL CITRATE (PF) 100 MCG/2ML IJ SOLN
INTRAMUSCULAR | Status: AC
  Administered 2023-03-02: 50 ug
  Filled 2023-03-02: qty 2

## 2023-03-02 MED ORDER — CEFAZOLIN SODIUM-DEXTROSE 2-4 GM/100ML-% IV SOLN
INTRAVENOUS | Status: AC
  Filled 2023-03-02: qty 100

## 2023-03-02 MED ORDER — METHOCARBAMOL 500 MG PO TABS
500.0000 mg | ORAL_TABLET | Freq: Four times a day (QID) | ORAL | Status: DC | PRN
  Administered 2023-03-04: 500 mg via ORAL

## 2023-03-02 MED ORDER — METOPROLOL TARTRATE 5 MG/5ML IV SOLN: 5.0000 mg | Freq: Four times a day (QID) | INTRAVENOUS | Status: DC | PRN

## 2023-03-02 MED ORDER — PROPOFOL 10 MG/ML IV BOLUS
100.0000 mg | Freq: Once | INTRAVENOUS | Status: AC
  Administered 2023-03-02: 100 mg via INTRAVENOUS

## 2023-03-02 MED ORDER — ACETAMINOPHEN 500 MG PO TABS
1000.0000 mg | ORAL_TABLET | Freq: Four times a day (QID) | ORAL | Status: DC
  Administered 2023-03-02 – 2023-03-04 (×9): 1000 mg via ORAL
  Filled 2023-03-02 (×9): qty 2

## 2023-03-02 MED ORDER — LIDOCAINE 2% (20 MG/ML) 5 ML SYRINGE
INTRAMUSCULAR | Status: DC | PRN
  Administered 2023-03-02: 60 mg via INTRAVENOUS

## 2023-03-02 MED ORDER — CEFAZOLIN SODIUM-DEXTROSE 2-4 GM/100ML-% IV SOLN
2.0000 g | INTRAVENOUS | Status: AC
  Administered 2023-03-02: 2 g via INTRAVENOUS

## 2023-03-02 MED ORDER — ORAL CARE MOUTH RINSE: 15.0000 mL | Freq: Once | OROMUCOSAL | Status: AC

## 2023-03-02 MED ORDER — LIDOCAINE 5 % EX PTCH
1.0000 | MEDICATED_PATCH | CUTANEOUS | Status: DC
  Administered 2023-03-03 – 2023-03-04 (×2): 1 via TRANSDERMAL
  Filled 2023-03-02 (×2): qty 1

## 2023-03-02 MED ORDER — POVIDONE-IODINE 10 % EX SWAB
2.0000 | Freq: Once | CUTANEOUS | Status: AC
  Administered 2023-03-02: 2 via TOPICAL

## 2023-03-02 MED ORDER — 0.9 % SODIUM CHLORIDE (POUR BTL) OPTIME
TOPICAL | Status: DC | PRN
  Administered 2023-03-02: 1000 mL

## 2023-03-02 MED ORDER — LIDOCAINE 2% (20 MG/ML) 5 ML SYRINGE
INTRAMUSCULAR | Status: AC
  Filled 2023-03-02: qty 20

## 2023-03-02 MED ORDER — LACTATED RINGERS IV SOLN: INTRAVENOUS | Status: DC | PRN

## 2023-03-02 MED ORDER — IOHEXOL 350 MG/ML SOLN
75.0000 mL | Freq: Once | INTRAVENOUS | Status: AC | PRN
  Administered 2023-03-02: 75 mL via INTRAVENOUS

## 2023-03-02 MED ORDER — OXYCODONE HCL 5 MG PO TABS
5.0000 mg | ORAL_TABLET | ORAL | Status: DC | PRN
  Administered 2023-03-02: 10 mg via ORAL
  Administered 2023-03-03: 5 mg via ORAL
  Administered 2023-03-04: 10 mg via ORAL
  Administered 2023-03-04: 5 mg via ORAL
  Filled 2023-03-02: qty 2
  Filled 2023-03-02: qty 1
  Filled 2023-03-02: qty 2

## 2023-03-02 MED ORDER — MORPHINE SULFATE (PF) 2 MG/ML IV SOLN
INTRAVENOUS | Status: AC
  Administered 2023-03-02: 2 mg via INTRAVENOUS
  Filled 2023-03-02: qty 1

## 2023-03-02 MED ORDER — HYDRALAZINE HCL 20 MG/ML IJ SOLN: 10.0000 mg | INTRAMUSCULAR | Status: DC | PRN

## 2023-03-02 MED ORDER — PANTOPRAZOLE SODIUM 40 MG PO TBEC
40.0000 mg | DELAYED_RELEASE_TABLET | Freq: Every day | ORAL | Status: DC
  Administered 2023-03-03 – 2023-03-04 (×2): 40 mg via ORAL
  Filled 2023-03-02 (×2): qty 1

## 2023-03-02 MED ORDER — PROPOFOL 10 MG/ML IV BOLUS
0.5000 mg/kg | Freq: Once | INTRAVENOUS | Status: DC
  Filled 2023-03-02: qty 20

## 2023-03-02 MED ORDER — SUGAMMADEX SODIUM 200 MG/2ML IV SOLN
INTRAVENOUS | Status: DC | PRN
  Administered 2023-03-02: 174 mg via INTRAVENOUS

## 2023-03-02 MED ORDER — PROPOFOL 10 MG/ML IV BOLUS
INTRAVENOUS | Status: DC | PRN
  Administered 2023-03-02: 150 mg via INTRAVENOUS

## 2023-03-02 MED ORDER — CHLORHEXIDINE GLUCONATE 4 % EX SOLN: 60.0000 mL | Freq: Once | CUTANEOUS | Status: DC

## 2023-03-02 MED ORDER — ACETAMINOPHEN 500 MG PO TABS: 1000.0000 mg | ORAL_TABLET | Freq: Once | ORAL | Status: DC

## 2023-03-02 MED ORDER — FENTANYL CITRATE PF 50 MCG/ML IJ SOSY
50.0000 ug | PREFILLED_SYRINGE | Freq: Once | INTRAMUSCULAR | Status: AC
  Administered 2023-03-02: 50 ug via INTRAVENOUS
  Filled 2023-03-02: qty 1

## 2023-03-02 MED ORDER — METHOCARBAMOL 500 MG PO TABS
500.0000 mg | ORAL_TABLET | Freq: Four times a day (QID) | ORAL | Status: DC
  Administered 2023-03-02 – 2023-03-04 (×7): 500 mg via ORAL
  Filled 2023-03-02 (×8): qty 1

## 2023-03-02 MED ORDER — MORPHINE SULFATE (PF) 2 MG/ML IV SOLN: 2.0000 mg | INTRAVENOUS | Status: DC | PRN

## 2023-03-02 MED ORDER — MIDAZOLAM HCL 2 MG/2ML IJ SOLN
INTRAMUSCULAR | Status: AC
  Administered 2023-03-02: 2 mg via INTRAVENOUS
  Filled 2023-03-02: qty 2

## 2023-03-02 MED ORDER — DOCUSATE SODIUM 100 MG PO CAPS: 100.0000 mg | ORAL_CAPSULE | Freq: Two times a day (BID) | ORAL | Status: DC

## 2023-03-02 MED ORDER — HYDROMORPHONE HCL 1 MG/ML IJ SOLN: 0.2500 mg | INTRAMUSCULAR | Status: DC | PRN

## 2023-03-02 MED ORDER — DEXAMETHASONE SODIUM PHOSPHATE 10 MG/ML IJ SOLN
INTRAMUSCULAR | Status: DC | PRN
  Administered 2023-03-02: 10 mg via INTRAVENOUS

## 2023-03-02 MED ORDER — TETANUS-DIPHTH-ACELL PERTUSSIS 5-2.5-18.5 LF-MCG/0.5 IM SUSY
0.5000 mL | PREFILLED_SYRINGE | Freq: Once | INTRAMUSCULAR | Status: AC
  Administered 2023-03-02: 0.5 mL via INTRAMUSCULAR
  Filled 2023-03-02: qty 0.5

## 2023-03-02 MED ORDER — FENTANYL CITRATE (PF) 250 MCG/5ML IJ SOLN
INTRAMUSCULAR | Status: AC
  Filled 2023-03-02: qty 5

## 2023-03-02 MED ORDER — ALBUMIN HUMAN 5 % IV SOLN: INTRAVENOUS | Status: DC | PRN

## 2023-03-02 MED ORDER — ONDANSETRON HCL 4 MG/2ML IJ SOLN: 4.0000 mg | Freq: Four times a day (QID) | INTRAMUSCULAR | Status: DC | PRN

## 2023-03-02 MED ORDER — METHOCARBAMOL 1000 MG/10ML IJ SOLN: 500.0000 mg | Freq: Four times a day (QID) | INTRAVENOUS | Status: DC | PRN

## 2023-03-02 MED ORDER — ROCURONIUM BROMIDE 10 MG/ML (PF) SYRINGE
PREFILLED_SYRINGE | INTRAVENOUS | Status: AC
  Filled 2023-03-02: qty 40

## 2023-03-02 MED ORDER — CHLORHEXIDINE GLUCONATE 0.12 % MT SOLN
15.0000 mL | Freq: Once | OROMUCOSAL | Status: AC
  Filled 2023-03-02: qty 15

## 2023-03-02 MED ORDER — VANCOMYCIN HCL 1000 MG IV SOLR
INTRAVENOUS | Status: DC | PRN
  Administered 2023-03-02: 1000 mg

## 2023-03-02 MED ORDER — KETOROLAC TROMETHAMINE 30 MG/ML IJ SOLN: 30.0000 mg | Freq: Once | INTRAMUSCULAR | Status: DC | PRN

## 2023-03-02 MED ORDER — ONDANSETRON 4 MG PO TBDP: 4.0000 mg | ORAL_TABLET | Freq: Four times a day (QID) | ORAL | Status: DC | PRN

## 2023-03-02 MED ORDER — FENTANYL CITRATE (PF) 250 MCG/5ML IJ SOLN
INTRAMUSCULAR | Status: DC | PRN
  Administered 2023-03-02 (×2): 100 ug via INTRAVENOUS
  Administered 2023-03-02: 50 ug via INTRAVENOUS

## 2023-03-02 MED ORDER — SODIUM CHLORIDE 0.9 % IV SOLN: INTRAVENOUS | Status: DC

## 2023-03-02 MED ORDER — METHOCARBAMOL 1000 MG/10ML IJ SOLN
500.0000 mg | Freq: Four times a day (QID) | INTRAVENOUS | Status: DC
  Filled 2023-03-02 (×2): qty 5

## 2023-03-02 MED ORDER — ONDANSETRON HCL 4 MG/2ML IJ SOLN: 4.0000 mg | Freq: Once | INTRAMUSCULAR | Status: DC | PRN

## 2023-03-02 MED ORDER — METOCLOPRAMIDE HCL 5 MG/ML IJ SOLN: 5.0000 mg | Freq: Three times a day (TID) | INTRAMUSCULAR | Status: DC | PRN

## 2023-03-02 MED ORDER — ROCURONIUM BROMIDE 10 MG/ML (PF) SYRINGE
PREFILLED_SYRINGE | INTRAVENOUS | Status: DC | PRN
  Administered 2023-03-02: 70 mg via INTRAVENOUS

## 2023-03-02 MED ORDER — METOCLOPRAMIDE HCL 5 MG PO TABS: 5.0000 mg | ORAL_TABLET | Freq: Three times a day (TID) | ORAL | Status: DC | PRN

## 2023-03-02 MED ORDER — DEXAMETHASONE SODIUM PHOSPHATE 10 MG/ML IJ SOLN
INTRAMUSCULAR | Status: AC
  Filled 2023-03-02: qty 3

## 2023-03-02 MED ORDER — OXYCODONE HCL 5 MG PO TABS: 5.0000 mg | ORAL_TABLET | Freq: Once | ORAL | Status: DC | PRN

## 2023-03-02 MED ORDER — KETOROLAC TROMETHAMINE 15 MG/ML IJ SOLN
15.0000 mg | Freq: Four times a day (QID) | INTRAMUSCULAR | Status: DC
  Administered 2023-03-02 – 2023-03-04 (×9): 15 mg via INTRAVENOUS
  Filled 2023-03-02 (×9): qty 1

## 2023-03-02 MED ORDER — BUPIVACAINE HCL (PF) 0.5 % IJ SOLN
INTRAMUSCULAR | Status: DC | PRN
  Administered 2023-03-02: 25 mL via PERINEURAL

## 2023-03-02 MED ORDER — OXYCODONE HCL 5 MG/5ML PO SOLN: 5.0000 mg | Freq: Once | ORAL | Status: DC | PRN

## 2023-03-02 MED ORDER — PANTOPRAZOLE SODIUM 40 MG IV SOLR
40.0000 mg | Freq: Every day | INTRAVENOUS | Status: DC
  Filled 2023-03-02: qty 10

## 2023-03-02 SURGICAL SUPPLY — 78 items
APL PRP STRL LF DISP 70% ISPRP (MISCELLANEOUS)
BAG COUNTER SPONGE SURGICOUNT (BAG) ×1 IMPLANT
BAG SPNG CNTER NS LX DISP (BAG)
BANDAGE ESMARK 6X9 LF (GAUZE/BANDAGES/DRESSINGS) ×1 IMPLANT
BIT DRILL QC 2.0 SHORT EVOS SM (DRILL) IMPLANT
BIT DRILL QC 2.5MM SHRT EVO SM (DRILL) IMPLANT
BNDG CMPR 5X4 KNIT ELC UNQ LF (GAUZE/BANDAGES/DRESSINGS) ×1
BNDG CMPR 6 X 5 YARDS HK CLSR (GAUZE/BANDAGES/DRESSINGS) ×1
BNDG CMPR 9X6 STRL LF SNTH (GAUZE/BANDAGES/DRESSINGS)
BNDG COHESIVE 4X5 TAN STRL (GAUZE/BANDAGES/DRESSINGS) ×1 IMPLANT
BNDG ELASTIC 4INX 5YD STR LF (GAUZE/BANDAGES/DRESSINGS) IMPLANT
BNDG ELASTIC 4X5.8 VLCR STR LF (GAUZE/BANDAGES/DRESSINGS) IMPLANT
BNDG ELASTIC 6INX 5YD STR LF (GAUZE/BANDAGES/DRESSINGS) IMPLANT
BNDG ELASTIC 6X5.8 VLCR STR LF (GAUZE/BANDAGES/DRESSINGS) IMPLANT
BNDG ESMARK 6X9 LF (GAUZE/BANDAGES/DRESSINGS)
BRUSH SCRUB EZ PLAIN DRY (MISCELLANEOUS) ×2 IMPLANT
CHLORAPREP W/TINT 26 (MISCELLANEOUS) ×1 IMPLANT
COVER SURGICAL LIGHT HANDLE (MISCELLANEOUS) ×1 IMPLANT
DRAPE C-ARM 42X72 X-RAY (DRAPES) ×1 IMPLANT
DRAPE C-ARMOR (DRAPES) ×1 IMPLANT
DRAPE ORTHO SPLIT 77X108 STRL (DRAPES) ×2
DRAPE SURG ORHT 6 SPLT 77X108 (DRAPES) ×2 IMPLANT
DRAPE U-SHAPE 47X51 STRL (DRAPES) ×1 IMPLANT
DRILL QC 2.0 SHORT EVOS SM (DRILL) ×1
DRILL QC 2.5MM SHORT EVOS SM (DRILL) ×1
DRSG ADAPTIC 3X8 NADH LF (GAUZE/BANDAGES/DRESSINGS) IMPLANT
DRSG MEPITEL 4X7.2 (GAUZE/BANDAGES/DRESSINGS) IMPLANT
ELECT REM PT RETURN 9FT ADLT (ELECTROSURGICAL) ×1
ELECTRODE REM PT RTRN 9FT ADLT (ELECTROSURGICAL) ×1 IMPLANT
GAUZE SPONGE 4X4 12PLY STRL (GAUZE/BANDAGES/DRESSINGS) IMPLANT
GLOVE BIO SURGEON STRL SZ 6.5 (GLOVE) ×3 IMPLANT
GLOVE BIO SURGEON STRL SZ7.5 (GLOVE) ×3 IMPLANT
GLOVE BIOGEL PI IND STRL 6.5 (GLOVE) ×1 IMPLANT
GLOVE BIOGEL PI IND STRL 7.5 (GLOVE) ×1 IMPLANT
GOWN STRL REUS W/ TWL LRG LVL3 (GOWN DISPOSABLE) ×2 IMPLANT
GOWN STRL REUS W/TWL LRG LVL3 (GOWN DISPOSABLE) ×2
HANDPIECE INTERPULSE COAX TIP (DISPOSABLE) ×1
K-WIRE 1.6 (WIRE) ×1
K-WIRE FX150X1.6XTROC PNT (WIRE) ×1
KIT TURNOVER KIT B (KITS) ×1 IMPLANT
KWIRE FX150X1.6XTROC PNT (WIRE) IMPLANT
MANIFOLD NEPTUNE II (INSTRUMENTS) ×1 IMPLANT
NDL HYPO 21X1.5 SAFETY (NEEDLE) IMPLANT
NDL HYPO 25GX1X1/2 BEV (NEEDLE) ×1 IMPLANT
NEEDLE HYPO 21X1.5 SAFETY (NEEDLE) IMPLANT
NEEDLE HYPO 25GX1X1/2 BEV (NEEDLE) IMPLANT
NS IRRIG 1000ML POUR BTL (IV SOLUTION) ×1 IMPLANT
PACK TOTAL JOINT (CUSTOM PROCEDURE TRAY) ×1 IMPLANT
PAD ARMBOARD 7.5X6 YLW CONV (MISCELLANEOUS) ×2 IMPLANT
PAD CAST 4YDX4 CTTN HI CHSV (CAST SUPPLIES) IMPLANT
PADDING CAST COTTON 4X4 STRL (CAST SUPPLIES) ×1
PADDING CAST COTTON 6X4 STRL (CAST SUPPLIES) IMPLANT
PLATE LOCK EVOS 3.5X94 8H (Plate) IMPLANT
SCREW CORT 3.5X11 ST EVOS (Screw) IMPLANT
SCREW CORT EVOS ST 3.5X12 (Screw) IMPLANT
SCREW CORT ST EVOS 3.5X46 (Screw) IMPLANT
SCREW CORT ST EVOS 3.5X55 (Screw) IMPLANT
SCREW CTX 3.5X34MM EVOS (Screw) IMPLANT
SCREW CTX ST EVOS 3.5X40 (Screw) IMPLANT
SCREW LOCK 3.5X15 EVOS (Screw) IMPLANT
SCREW LOCK CORTICAL 3.5X42 (Screw) IMPLANT
SET HNDPC FAN SPRY TIP SCT (DISPOSABLE) IMPLANT
SPLINT PLASTER CAST FAST 5X30 (CAST SUPPLIES) IMPLANT
SPONGE T-LAP 18X18 ~~LOC~~+RFID (SPONGE) IMPLANT
STAPLER VISISTAT 35W (STAPLE) ×1 IMPLANT
SUCTION TUBE FRAZIER 10FR DISP (SUCTIONS) ×1 IMPLANT
SUT ETHILON 3 0 PS 1 (SUTURE) ×2 IMPLANT
SUT MON AB 2-0 CT1 36 (SUTURE) IMPLANT
SUT PROLENE 0 CT (SUTURE) IMPLANT
SUT VIC AB 0 CT1 27 (SUTURE) ×1
SUT VIC AB 0 CT1 27XBRD ANBCTR (SUTURE) ×1 IMPLANT
SUT VIC AB 2-0 CT1 27 (SUTURE) ×1
SUT VIC AB 2-0 CT1 TAPERPNT 27 (SUTURE) ×2 IMPLANT
SYR CONTROL 10ML LL (SYRINGE) ×1 IMPLANT
TOWEL GREEN STERILE (TOWEL DISPOSABLE) ×2 IMPLANT
TOWEL GREEN STERILE FF (TOWEL DISPOSABLE) ×1 IMPLANT
UNDERPAD 30X36 HEAVY ABSORB (UNDERPADS AND DIAPERS) ×1 IMPLANT
WATER STERILE IRR 1000ML POUR (IV SOLUTION) ×1 IMPLANT

## 2023-03-02 NOTE — Anesthesia Preprocedure Evaluation (Signed)
Anesthesia Evaluation  Patient identified by MRN, date of birth, ID band Patient awake    Reviewed: Allergy & Precautions, H&P , NPO status , Patient's Chart, lab work & pertinent test results  Airway Mallampati: II  TM Distance: >3 FB Neck ROM: Full    Dental no notable dental hx.    Pulmonary Current Smoker   Pulmonary exam normal breath sounds clear to auscultation       Cardiovascular negative cardio ROS Normal cardiovascular exam Rhythm:Regular Rate:Normal     Neuro/Psych negative neurological ROS  negative psych ROS   GI/Hepatic Neg liver ROS,GERD  ,,  Endo/Other  negative endocrine ROS    Renal/GU negative Renal ROS  negative genitourinary   Musculoskeletal negative musculoskeletal ROS (+)    Abdominal   Peds negative pediatric ROS (+)  Hematology negative hematology ROS (+)   Anesthesia Other Findings   Reproductive/Obstetrics negative OB ROS                             Anesthesia Physical Anesthesia Plan  ASA: 2  Anesthesia Plan: General   Post-op Pain Management: Regional block* and Tylenol PO (pre-op)*   Induction: Intravenous  PONV Risk Score and Plan: 2 and Ondansetron, Dexamethasone and Treatment may vary due to age or medical condition  Airway Management Planned: Oral ETT  Additional Equipment:   Intra-op Plan:   Post-operative Plan: Extubation in OR  Informed Consent: I have reviewed the patients History and Physical, chart, labs and discussed the procedure including the risks, benefits and alternatives for the proposed anesthesia with the patient or authorized representative who has indicated his/her understanding and acceptance.     Dental advisory given  Plan Discussed with: CRNA and Surgeon  Anesthesia Plan Comments:        Anesthesia Quick Evaluation

## 2023-03-02 NOTE — Discharge Instructions (Addendum)
Truitt Merle, MD Thyra Breed PA-C [facility] [address] [phone] (tel)   [phone] (fax)                                  POST-OPERATIVE INSTRUCTIONS     WEIGHT BEARING STATUS: Non-weightbearing right lower extremity  RANGE OF MOTION/ACTIVITY:  Ok for knee motion. Do not remove splint  WOUND CARE Please keep splint clean dry and intact until follow-up. If your splint gets wet for any reason please contact the office immediately.  Do not stick anything down your splint such as pencils, momey, hangers to try and scratch yourself.  If you feel itchy take Benadryl as prescribed on the bottle for itching You may shower on Post-Op Day #2.  You must keep splint dry during this process and may find that a plastic bag taped around the extremity or alternatively a towel based bath may be a better option.   If you get your splint wet or if it is damaged please contact our clinic.  EXERCISES Due to your splint being in place you will not be able to bear weight through your extremity.   DO NOT PUT ANY WEIGHT ON YOUR OPERATIVE LEG Please use crutches or a walker to avoid weight bearing.   DVT/PE prophylaxis: Aspirin  DIET: As you were eating previously.  Can use over the counter stool softeners and bowel preparations, such as Miralax, to help with bowel movements.  Narcotics can be constipating.  Be sure to drink plenty of fluids  REGIONAL ANESTHESIA (NERVE BLOCKS) The anesthesia team may have performed a nerve block for you if safe in the setting of your care.  This is a great tool used to minimize pain.  Typically the block may start wearing off overnight but the long acting medicine may last for 3-4 days.  The nerve block wearing off can be a challenging period but please utilize your as needed pain medications to try and manage this period.    POST-OP MEDICATIONS- Multimodal approach to pain control  In general your pain will be controlled with a  combination of substances.  Prescriptions unless otherwise discussed are electronically sent to your pharmacy.  This is a carefully made plan we use to minimize narcotic use.     - Meloxicam OR Celebrex - Anti-inflammatory medication taken on a scheduled basis  - Acetaminophen - Non-narcotic pain medicine taken on a scheduled basis   - Oxycodone - This is a strong narcotic, to be used only on an "as needed" basis for pain.  -  Aspirin 325mg  - This medicine is used to minimize the risk of blood clots after surgery.             -          Zofran - take as needed for nausea   FOLLOW-UP If you develop a Fever (>101.5), Redness or Drainage from the surgical incision site, please call our office to arrange for an evaluation. Please call the office to schedule a follow-up appointment for your incision check if you do not already have one, 7-10 days post-operatively.   VISIT OUR WEBSITE FOR ADDITIONAL INFORMATION: [email or web address]   HELPFUL INFORMATION  If you had a block, it will wear off between 8-24 hrs postop typically.  This is period when your pain may go from nearly zero to the pain you would have had postop without the block.  This is an abrupt transition but nothing dangerous is happening.  You may take an extra dose of narcotic when this happens.  You should wean off your narcotic medicines as soon as you are able.  Most patients will be off or using minimal narcotics before their first postop appointment.   We suggest you use the pain medication the first night prior to going to bed, in order to ease any pain when the anesthesia wears off. You should avoid taking pain medications on an empty stomach as it will make you nauseous.  Do not drink alcoholic beverages or take illicit drugs when taking pain medications.  In most states it is against the law to drive while you are in a splint or sling.  And certainly against the law to drive while taking narcotics.  You may return to  work/school in the next couple of days when you feel up to it.   Pain medication may make you constipated.  Below are a few solutions to try in this order: Decrease the amount of pain medication if you aren't having pain. Drink lots of decaffeinated fluids. Drink prune juice and/or each dried prunes  If the first 3 don't work start with additional solutions Take Colace - an over-the-counter stool softener Take Senokot - an over-the-counter laxative Take Miralax - a stronger over-the-counter laxative

## 2023-03-02 NOTE — Anesthesia Procedure Notes (Signed)
Anesthesia Regional Block: Adductor canal block   Pre-Anesthetic Checklist: , timeout performed,  Correct Patient, Correct Site, Correct Laterality,  Correct Procedure, Correct Position, site marked,  Risks and benefits discussed,  Surgical consent,  Pre-op evaluation,  At surgeon's request and post-op pain management  Laterality: Right  Prep: chloraprep       Needles:  Injection technique: Single-shot  Needle Type: Echogenic Needle     Needle Length: 9cm      Additional Needles:   Procedures:,,,, ultrasound used (permanent image in chart),,    Narrative:  Start time: 03/02/2023 1:28 PM End time: 03/02/2023 1:35 PM Injection made incrementally with aspirations every 5 mL.  Performed by: Personally  Anesthesiologist: Eilene Ghazi, MD  Additional Notes: Patient tolerated the procedure well without complications

## 2023-03-02 NOTE — Anesthesia Procedure Notes (Signed)
Anesthesia Regional Block: Popliteal block   Pre-Anesthetic Checklist: , timeout performed,  Correct Patient, Correct Site, Correct Laterality,  Correct Procedure, Correct Position, site marked,  Risks and benefits discussed,  Surgical consent,  Pre-op evaluation,  At surgeon's request and post-op pain management  Laterality: Right  Prep: chloraprep       Needles:  Injection technique: Single-shot  Needle Type: Echogenic Needle     Needle Length: 9cm      Additional Needles:   Procedures:,,,, ultrasound used (permanent image in chart),,    Narrative:  Start time: 03/02/2023 1:20 PM End time: 03/02/2023 1:27 PM Injection made incrementally with aspirations every 5 mL.  Performed by: Personally  Anesthesiologist: Eilene Ghazi, MD  Additional Notes: Patient tolerated the procedure well without complications
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
## 2023-03-02 NOTE — Plan of Care (Signed)
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
## 2023-03-02 NOTE — Anesthesia Procedure Notes (Signed)
Procedure Name: Intubation Date/Time: 03/02/2023 2:13 PM  Performed by: Marena Chancy, CRNAPre-anesthesia Checklist: Patient identified, Emergency Drugs available, Suction available and Patient being monitored Patient Re-evaluated:Patient Re-evaluated prior to induction Oxygen Delivery Method: Circle System Utilized Preoxygenation: Pre-oxygenation with 100% oxygen Induction Type: IV induction Ventilation: Mask ventilation without difficulty Laryngoscope Size: Miller and 2 Grade View: Grade I Tube type: Oral Tube size: 7.0 mm Number of attempts: 1 Airway Equipment and Method: Stylet and Oral airway Placement Confirmation: ETT inserted through vocal cords under direct vision, positive ETCO2 and breath sounds checked- equal and bilateral Tube secured with: Tape Dental Injury: Teeth and Oropharynx as per pre-operative assessment

## 2023-03-02 NOTE — ED Provider Notes (Signed)
.  Sedation  Date/Time: 03/02/2023 11:32 AM  Performed by: Margarita Grizzle, MD Authorized by: Margarita Grizzle, MD   Consent:    Consent obtained:  Written   Consent given by:  Patient   Risks discussed:  Prolonged hypoxia resulting in organ damage, prolonged sedation necessitating reversal, inadequate sedation and respiratory compromise necessitating ventilatory assistance and intubation   Alternatives discussed:  Analgesia without sedation Universal protocol:    Immediately prior to procedure, a time out was called: yes     Patient identity confirmed:  Arm band and verbally with patient Indications:    Procedure performed:  Dislocation reduction Pre-sedation assessment:    Time since last food or drink:  6   ASA classification: class 1 - normal, healthy patient     Mouth opening:  3 or more finger widths   Mallampati score:  II - soft palate, uvula, fauces visible   Neck mobility: normal     Pre-sedation assessments completed and reviewed: airway patency, cardiovascular function, mental status and respiratory function   Immediate pre-procedure details:    Reassessment: Patient reassessed immediately prior to procedure     Reviewed: vital signs and relevant labs/tests     Verified: bag valve mask available, emergency equipment available, intubation equipment available, IV patency confirmed, oxygen available and suction available   Procedure details (see MAR for exact dosages):    Preoxygenation:  Nasal cannula   Sedation:  Propofol   Intended level of sedation: deep   Analgesia:  Fentanyl   Intra-procedure monitoring:  Blood pressure monitoring, continuous capnometry, cardiac monitor and continuous pulse oximetry   Intra-procedure events: none     Intra-procedure management:  Airway repositioning   Total Provider sedation time (minutes):  30 Post-procedure details:    Post-sedation assessment completed:  03/02/2023 11:33 AM     Margarita Grizzle, MD 03/02/23 1134

## 2023-03-02 NOTE — Interval H&P Note (Signed)
History and Physical Interval Note:  03/02/2023 1:14 PM  Jessica Lane  has presented today for surgery, with the diagnosis of Ankle fracture.  The various methods of treatment have been discussed with the patient and family. After consideration of risks, benefits and other options for treatment, the patient has consented to  Procedure(s): IRRIGATION AND DEBRIDEMENT; EXTERNAL FIXATION VS. OPEN REDUCTION INTERNAL FIXATION (ORIF) RIGHT ANKLE FRACTURE (Right) as a surgical intervention.  The patient's history has been reviewed, patient examined, no change in status, stable for surgery.  I have reviewed the patient's chart and labs.  Questions were answered to the patient's satisfaction.     Caryn Bee P Phiona Ramnauth
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
## 2023-03-02 NOTE — ED Notes (Signed)
Report given to or.
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
## 2023-03-02 NOTE — Transfer of Care (Signed)
Immediate Anesthesia Transfer of Care Note  Patient: Jessica Lane  Procedure(s) Performed: IRRIGATION AND DEBRIDEMENT; OPEN REDUCTION INTERNAL FIXATION (ORIF) RIGHT ANKLE FRACTURE (Right: Ankle)  Patient Location: PACU  Anesthesia Type:GA combined with regional for post-op pain  Level of Consciousness: awake, alert , and oriented  Airway & Oxygen Therapy: Patient Spontanous Breathing and Patient connected to nasal cannula oxygen  Post-op Assessment: Report given to RN and Post -op Vital signs reviewed and stable  Post vital signs: Reviewed and stable  Last Vitals:  Vitals Value Taken Time  BP 186/103 03/02/23 1604  Temp 36.5 C 03/02/23 1604  Pulse 104 03/02/23 1615  Resp 13 03/02/23 1615  SpO2 98 % 03/02/23 1615  Vitals shown include unfiled device data.  Last Pain:  Vitals:   03/02/23 1320  TempSrc:   PainSc: 9          Complications: No notable events documented.
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
## 2023-03-02 NOTE — Anesthesia Procedure Notes (Signed)
Anesthesia Procedure Image    

## 2023-03-02 NOTE — Procedures (Signed)
Procedure: Right ankle reduction   Indication: Right ankle fracture   Surgeon: Charma Igo, PA-C   Assist: None   Anesthesia: Propofol via EDP   EBL: None   Complications: None   Findings: After risks/benefits explained patient desires to undergo procedure. Consent obtained and time out performed. Sedation given and efficacy confirmed. Ankle reduced, dressed, and splinted. Pt tolerated the procedure well.       Freeman Caldron, PA-C Orthopedic Surgery [phone]

## 2023-03-02 NOTE — Progress Notes (Signed)
Orthopedic Tech Progress Note Patient Details:  Jessica Lane 08-08-1960 [id number]  Ortho Devices Type of Ortho Device: Short leg splint, Stirrup splint Ortho Device/Splint Location: Right ankle Ortho Device/Splint Interventions: Application   Post Interventions Patient Tolerated: Well  Genelle Bal Euriah Matlack 03/02/2023, 11:36 AM

## 2023-03-03 DIAGNOSIS — S82851C Displaced trimalleolar fracture of right lower leg, initial encounter for open fracture type IIIA, IIIB, or IIIC: Secondary | ICD-10-CM | POA: Diagnosis present

## 2023-03-03 DIAGNOSIS — Z818 Family history of other mental and behavioral disorders: Secondary | ICD-10-CM | POA: Diagnosis not present

## 2023-03-03 DIAGNOSIS — Z23 Encounter for immunization: Secondary | ICD-10-CM | POA: Diagnosis not present

## 2023-03-03 DIAGNOSIS — Y92410 Unspecified street and highway as the place of occurrence of the external cause: Secondary | ICD-10-CM | POA: Diagnosis not present

## 2023-03-03 DIAGNOSIS — D62 Acute posthemorrhagic anemia: Secondary | ICD-10-CM | POA: Diagnosis not present

## 2023-03-03 DIAGNOSIS — E876 Hypokalemia: Secondary | ICD-10-CM | POA: Diagnosis not present

## 2023-03-03 DIAGNOSIS — S2241XA Multiple fractures of ribs, right side, initial encounter for closed fracture: Secondary | ICD-10-CM | POA: Diagnosis present

## 2023-03-03 DIAGNOSIS — F17210 Nicotine dependence, cigarettes, uncomplicated: Secondary | ICD-10-CM | POA: Diagnosis present

## 2023-03-03 DIAGNOSIS — R7401 Elevation of levels of liver transaminase levels: Secondary | ICD-10-CM | POA: Diagnosis present

## 2023-03-03 DIAGNOSIS — K219 Gastro-esophageal reflux disease without esophagitis: Secondary | ICD-10-CM | POA: Diagnosis present

## 2023-03-03 DIAGNOSIS — Z833 Family history of diabetes mellitus: Secondary | ICD-10-CM | POA: Diagnosis not present

## 2023-03-03 DIAGNOSIS — Z8249 Family history of ischemic heart disease and other diseases of the circulatory system: Secondary | ICD-10-CM | POA: Diagnosis not present

## 2023-03-03 DIAGNOSIS — Z791 Long term (current) use of non-steroidal anti-inflammatories (NSAID): Secondary | ICD-10-CM | POA: Diagnosis not present

## 2023-03-03 DIAGNOSIS — E279 Disorder of adrenal gland, unspecified: Secondary | ICD-10-CM | POA: Diagnosis present

## 2023-03-03 DIAGNOSIS — S2220XA Unspecified fracture of sternum, initial encounter for closed fracture: Secondary | ICD-10-CM | POA: Diagnosis present

## 2023-03-03 DIAGNOSIS — Z79899 Other long term (current) drug therapy: Secondary | ICD-10-CM | POA: Diagnosis not present

## 2023-03-03 DIAGNOSIS — Z841 Family history of disorders of kidney and ureter: Secondary | ICD-10-CM | POA: Diagnosis not present

## 2023-03-03 DIAGNOSIS — Z8261 Family history of arthritis: Secondary | ICD-10-CM | POA: Diagnosis not present

## 2023-03-03 LAB — COMPREHENSIVE METABOLIC PANEL
ALT: 37 U/L (ref 0–44)
AST: 44 U/L — ABNORMAL HIGH (ref 15–41)
Albumin: 3.2 g/dL — ABNORMAL LOW (ref 3.5–5.0)
Alkaline Phosphatase: 66 U/L (ref 38–126)
Anion gap: 11 (ref 5–15)
BUN: 19 mg/dL (ref 6–20)
CO2: 21 mmol/L — ABNORMAL LOW (ref 22–32)
Calcium: 8.5 mg/dL — ABNORMAL LOW (ref 8.9–10.3)
Chloride: 107 mmol/L (ref 98–111)
Creatinine, Ser: 0.83 mg/dL (ref 0.44–1.00)
GFR, Estimated: >60 mL/min (ref >60)
Glucose, Bld: 140 mg/dL — ABNORMAL HIGH (ref 70–99)
Potassium: 3.2 mmol/L — ABNORMAL LOW (ref 3.5–5.1)
Sodium: 139 mmol/L (ref 135–145)
Total Bilirubin: 0.6 mg/dL (ref 0.3–1.2)
Total Protein: 5.7 g/dL — ABNORMAL LOW (ref 6.5–8.1)

## 2023-03-03 LAB — CBC
HCT: 36.3 % (ref 36.0–46.0)
Hemoglobin: 11.9 g/dL — ABNORMAL LOW (ref 12.0–15.0)
MCH: 30.9 pg (ref 26.0–34.0)
MCHC: 32.8 g/dL (ref 30.0–36.0)
MCV: 94.3 fL (ref 80.0–100.0)
Platelets: 272 10*3/uL (ref 150–400)
RBC: 3.85 MIL/uL — ABNORMAL LOW (ref 3.87–5.11)
RDW: 13 % (ref 11.5–15.5)
WBC: 11.5 10*3/uL — ABNORMAL HIGH (ref 4.0–10.5)
nRBC: 0 % (ref 0.0–0.2)

## 2023-03-03 MED ORDER — POTASSIUM CHLORIDE 20 MEQ PO PACK
40.0000 meq | PACK | Freq: Two times a day (BID) | ORAL | Status: AC
  Administered 2023-03-03 (×2): 40 meq via ORAL
  Filled 2023-03-03 (×2): qty 2

## 2023-03-03 MED ORDER — VITAMIN D (ERGOCALCIFEROL) 1.25 MG (50000 UNIT) PO CAPS
50000.0000 [IU] | ORAL_CAPSULE | ORAL | Status: DC
  Administered 2023-03-03: 50000 [IU] via ORAL
  Filled 2023-03-03: qty 1

## 2023-03-03 NOTE — Plan of Care (Signed)
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
## 2023-03-03 NOTE — Anesthesia Postprocedure Evaluation (Signed)
Anesthesia Post Note  Patient: Jessica Lane  Procedure(s) Performed: IRRIGATION AND DEBRIDEMENT; OPEN REDUCTION INTERNAL FIXATION (ORIF) RIGHT ANKLE FRACTURE (Right: Ankle)     Patient location during evaluation: PACU Anesthesia Type: General Level of consciousness: awake and alert Pain management: pain level controlled Vital Signs Assessment: post-procedure vital signs reviewed and stable Respiratory status: spontaneous breathing, nonlabored ventilation, respiratory function stable and patient connected to nasal cannula oxygen Cardiovascular status: blood pressure returned to baseline and stable Postop Assessment: no apparent nausea or vomiting Anesthetic complications: no   No notable events documented.              Shelton Silvas
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
## 2023-03-03 NOTE — TOC CAGE-AID Note (Signed)
Transition of Care [facility]) - CAGE-AID Screening   Patient Details  Name: Jessica Lane MRN: [id number] Date of Birth: [birth year]  Transition of [facility]) CM/SW Contact:    Leota Sauers, RN Phone Number: 03/03/2023, 7:03 AM   Clinical Narrative:  Cherlynn Perches current alcohol use, denies illicit drug use. Resources not given at this time.  CAGE-AID Screening:    Have You Ever Felt You Ought to Cut Down on Your Drinking or Drug Use?: No Have People Annoyed You By Critizing Your Drinking Or Drug Use?: No Have You Felt Bad Or Guilty About Your Drinking Or Drug Use?: No Have You Ever Had a Drink or Used Drugs First Thing In The Morning to Steady Your Nerves or to Get Rid of a Hangover?: No CAGE-AID Score: 0  Substance Abuse Education Offered: No

## 2023-03-03 NOTE — Progress Notes (Signed)
Orthopaedic Trauma Progress Note  SUBJECTIVE: Doing okay this morning.  Notes some soreness in her sternum and ribs due to fractures.  Pain in ankles been fairly manageable.  She was able to mobilize out of bed with therapies this morning, states this went okay.  No SOB. No nausea/vomiting. No other complaints.  Patient with mild hypokalemia this morning, primary team has seen and is replacing today.   Patient is hopeful to return home by Sunday, as this is her 62th birthday.  OBJECTIVE:  Vitals:   03/03/23 0525 03/03/23 0741  BP: 107/67 (!) 114/56  Pulse: 87 91  Resp: 17 16  Temp: 98 F (36.7 C) 98.2 F (36.8 C)  SpO2: 94% 95%    General: Sitting up in bedside chair, no acute distress Respiratory: No increased work of breathing.  Right lower extremity: Well-padded, well-fitting short leg splint in place.  Nontender above splint.  Toes warm and well-perfused.  Able to wiggle toes.  Endorses sensation to light touch over all aspects of her toes.  The remainder of motor and sensory exam through the foot and ankle is limited due to splint placement.  IMAGING: Stable post op imaging.   LABS:  Results for orders placed or performed during the hospital encounter of 03/02/23 (from the past 24 hour(s))  Comprehensive metabolic panel     Status: Abnormal   Collection Time: 03/02/23  8:45 AM  Result Value Ref Range   Sodium 141 135 - 145 mmol/L   Potassium 3.7 3.5 - 5.1 mmol/L   Chloride 109 98 - 111 mmol/L   CO2 21 (L) 22 - 32 mmol/L   Glucose, Bld 128 (H) 70 - 99 mg/dL   BUN 14 6 - 20 mg/dL   Creatinine, Ser 0.75 0.44 - 1.00 mg/dL   Calcium 8.9 8.9 - 10.3 mg/dL   Total Protein 6.7 6.5 - 8.1 g/dL   Albumin 3.7 3.5 - 5.0 g/dL   AST 50 (H) 15 - 41 U/L   ALT 51 (H) 0 - 44 U/L   Alkaline Phosphatase 93 38 - 126 U/L   Total Bilirubin 0.6 0.3 - 1.2 mg/dL   GFR, Estimated >60 >60 mL/min   Anion gap 11 5 - 15  CBC     Status: Abnormal   Collection Time: 03/02/23  8:45 AM  Result Value  Ref Range   WBC 15.9 (H) 4.0 - 10.5 K/uL   RBC 5.09 3.87 - 5.11 MIL/uL   Hemoglobin 15.5 (H) 12.0 - 15.0 g/dL   HCT 47.3 (H) 36.0 - 46.0 %   MCV 92.9 80.0 - 100.0 fL   MCH 30.5 26.0 - 34.0 pg   MCHC 32.8 30.0 - 36.0 g/dL   RDW 12.8 11.5 - 15.5 %   Platelets 398 150 - 400 K/uL   nRBC 0.0 0.0 - 0.2 %  Protime-INR     Status: None   Collection Time: 03/02/23  8:45 AM  Result Value Ref Range   Prothrombin Time 12.8 11.4 - 15.2 seconds   INR 0.9 0.8 - 1.2  hCG, serum, qualitative     Status: None   Collection Time: 03/02/23  8:45 AM  Result Value Ref Range   Preg, Serum NEGATIVE NEGATIVE  Sample to Blood Bank     Status: None   Collection Time: 03/02/23  9:00 AM  Result Value Ref Range   Blood Bank Specimen SAMPLE AVAILABLE FOR TESTING    Sample Expiration      08 /24/2024,2359 Performed at [facility]  [facility], [address], [place], [place] [address]   I-Stat Chem 8, ED     Status: Abnormal   Collection Time: 03/02/23  9:10 AM  Result Value Ref Range   Sodium 140 135 - 145 mmol/L   Potassium 3.9 3.5 - 5.1 mmol/L   Chloride 114 (H) 98 - 111 mmol/L   BUN 17 6 - 20 mg/dL   Creatinine, Ser 1.30 0.44 - 1.00 mg/dL   Glucose, Bld 865 (H) 70 - 99 mg/dL   Calcium, Ion 7.84 (LL) 1.15 - 1.40 mmol/L   TCO2 19 (L) 22 - 32 mmol/L   Hemoglobin 15.6 (H) 12.0 - 15.0 g/dL   HCT 69.6 29.5 - 28.4 %   Comment NOTIFIED PHYSICIAN   I-Stat Lactic Acid, ED     Status: Abnormal   Collection Time: 03/02/23  9:10 AM  Result Value Ref Range   Lactic Acid, Venous 2.7 (HH) 0.5 - 1.9 mmol/L   Comment NOTIFIED PHYSICIAN   Ethanol     Status: None   Collection Time: 03/02/23  9:24 AM  Result Value Ref Range   Alcohol, Ethyl (B) <10 <10 mg/dL  Surgical pcr screen     Status: None   Collection Time: 03/02/23  1:15 PM   Specimen: Nasal Mucosa; Nasal Swab  Result Value Ref Range   MRSA, PCR NEGATIVE NEGATIVE   Staphylococcus aureus NEGATIVE NEGATIVE  HIV Antibody (routine testing w rflx)     Status:  None   Collection Time: 03/02/23  6:49 PM  Result Value Ref Range   HIV Screen 4th Generation wRfx Non Reactive Non Reactive  VITAMIN D 25 Hydroxy (Vit-D Deficiency, Fractures)     Status: Abnormal   Collection Time: 03/02/23  6:49 PM  Result Value Ref Range   Vit D, 25-Hydroxy 9.50 (L) 30 - 100 ng/mL    ASSESSMENT: Jessica Lane is a 62 y.o. female, 1 Day Post-Op s/p IRRIGATION AND DEBRIDEMENT RIGHT ANKLE ORIF RIGHT ANKLE FRACTURE  CV/Blood loss: Acute blood loss anemia, Hgb 11.9 this morning. Hemodynamically stable  PLAN: Weightbearing: NWB RLE ROM: Okay for knee motion.  Maintain splint Incisional and dressing care: Dressings left intact until follow-up  Showering: Okay to shower, keep splint dry Orthopedic device(s): Splint RLE Pain management:  1. Tylenol 1000 mg q 6 hours scheduled 2. Robaxin 500 mg q 6 hours PRN 3. Oxycodone 5-10 mg q 4 hours PRN 4. Morphine 2-4 mg q 4 hours PRN 5. Toradol 15 mg every 6 hours x 5 days VTE prophylaxis: Lovenox, SCDs ID: Ceftriaxone postop per open fracture protocol Foley/Lines:  No foley, KVO IVFs Impediments to Fracture Healing: Vitamin D level 9.5, started on supplementation today. Dispo: PT/OT evaluation today, dispo pending.  Maintain splint till follow-up.  Okay for discharge from ortho standpoint once cleared by trauma team and therapies  D/C recommendations: -Oxycodone, Robaxin for pain control -Aspirin 325 mg daily x 30 days for DVT prophylaxis -Continue 50,000 units weekly Vit D supplementation x 8 weeks  Follow - up plan: 2 weeks after discharge for wound check, repeat x-rays, suture/splint removal   Contact information:  Truitt Merle MD, Thyra Breed PA-C. After hours and holidays please check [email or web address] for group call information for [facility]   Thompson Caul, PA-C [phone] (office) [email or web address]

## 2023-03-04 LAB — CBC
HCT: 33.1 % — ABNORMAL LOW (ref 36.0–46.0)
Hemoglobin: 10.9 g/dL — ABNORMAL LOW (ref 12.0–15.0)
MCH: 30.8 pg (ref 26.0–34.0)
MCHC: 32.9 g/dL (ref 30.0–36.0)
MCV: 93.5 fL (ref 80.0–100.0)
Platelets: 207 10*3/uL (ref 150–400)
RBC: 3.54 MIL/uL — ABNORMAL LOW (ref 3.87–5.11)
RDW: 13.2 % (ref 11.5–15.5)
WBC: 8.4 10*3/uL (ref 4.0–10.5)
nRBC: 0 % (ref 0.0–0.2)

## 2023-03-04 LAB — BASIC METABOLIC PANEL
Anion gap: 9 (ref 5–15)
BUN: 18 mg/dL (ref 6–20)
CO2: 23 mmol/L (ref 22–32)
Calcium: 8.4 mg/dL — ABNORMAL LOW (ref 8.9–10.3)
Chloride: 107 mmol/L (ref 98–111)
Creatinine, Ser: 0.87 mg/dL (ref 0.44–1.00)
GFR, Estimated: >60 mL/min (ref >60)
Glucose, Bld: 133 mg/dL — ABNORMAL HIGH (ref 70–99)
Potassium: 3.5 mmol/L (ref 3.5–5.1)
Sodium: 139 mmol/L (ref 135–145)

## 2023-03-04 MED ORDER — POLYETHYLENE GLYCOL 3350 17 G PO PACK: 17.0000 g | PACK | Freq: Every day | ORAL | 0 refills | Status: DC | PRN

## 2023-03-04 MED ORDER — VITAMIN D (ERGOCALCIFEROL) 1.25 MG (50000 UNIT) PO CAPS: 50000.0000 [IU] | ORAL_CAPSULE | ORAL | 0 refills | Status: AC

## 2023-03-04 MED ORDER — METHOCARBAMOL 500 MG PO TABS: 500.0000 mg | ORAL_TABLET | Freq: Four times a day (QID) | ORAL | 0 refills | Status: DC | PRN

## 2023-03-04 MED ORDER — DOCUSATE SODIUM 100 MG PO CAPS: 100.0000 mg | ORAL_CAPSULE | Freq: Two times a day (BID) | ORAL | 0 refills | Status: DC

## 2023-03-04 MED ORDER — LIDOCAINE 5 % EX PTCH: 1.0000 | MEDICATED_PATCH | CUTANEOUS | 1 refills | Status: DC

## 2023-03-04 MED ORDER — ACETAMINOPHEN 500 MG PO TABS: 1000.0000 mg | ORAL_TABLET | Freq: Four times a day (QID) | ORAL | 0 refills | Status: AC

## 2023-03-04 MED ORDER — OXYCODONE HCL 5 MG PO TABS: 5.0000 mg | ORAL_TABLET | ORAL | 0 refills | Status: DC | PRN

## 2023-03-04 MED ORDER — ASPIRIN 325 MG PO TBEC: 325.0000 mg | DELAYED_RELEASE_TABLET | Freq: Every day | ORAL | 0 refills | Status: AC
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
## 2023-03-04 NOTE — Progress Notes (Signed)
    Durable Medical Equipment  (From admission, onward)           Start     Ordered   03/04/23 0912  For home use only DME Walker rolling  Once       Question Answer Comment  Walker: With 5 Inch Wheels   Patient needs a walker to treat with the following condition Ankle fracture      03/04/23 0911   03/04/23 0805  For home use only DME 3 n 1  Once        03/04/23 0805   03/04/23 0805  For home use only DME standard manual wheelchair with seat cushion  Once       Comments: Patient suffers from ankle fracture, rib fractures which impairs their ability to perform daily activities like bathing, dressing, and grooming in the home.  A cane, crutch, or walker will not resolve issue with performing activities of daily living. A wheelchair will allow patient to safely perform daily activities. Patient can safely propel the wheelchair in the home or has a caregiver who can provide assistance. Length of need 6 months . Accessories: elevating leg rests (ELRs), wheel locks, extensions and anti-tippers.   03/04/23 0805
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
## 2023-03-04 NOTE — Plan of Care (Signed)

## 2023-03-15 ENCOUNTER — Telehealth: Payer: Self-pay | Admitting: General Practice
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
## 2023-03-16 NOTE — Telephone Encounter (Addendum)
Pt not yet established with PCP. No TOC call will be completed.

## 2023-03-22 ENCOUNTER — Ambulatory Visit: Payer: 59 | Attending: General Surgery | Admitting: Physical Therapy

## 2023-03-22 DIAGNOSIS — M25571 Pain in right ankle and joints of right foot: Secondary | ICD-10-CM | POA: Diagnosis not present

## 2023-03-22 DIAGNOSIS — R2681 Unsteadiness on feet: Secondary | ICD-10-CM | POA: Diagnosis present

## 2023-03-22 DIAGNOSIS — R2689 Other abnormalities of gait and mobility: Secondary | ICD-10-CM | POA: Insufficient documentation

## 2023-03-22 DIAGNOSIS — M6281 Muscle weakness (generalized): Secondary | ICD-10-CM | POA: Insufficient documentation

## 2023-03-22 DIAGNOSIS — R41842 Visuospatial deficit: Secondary | ICD-10-CM | POA: Insufficient documentation
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
## 2023-03-24 ENCOUNTER — Ambulatory Visit (INDEPENDENT_AMBULATORY_CARE_PROVIDER_SITE_OTHER): Payer: 59 | Admitting: Internal Medicine

## 2023-03-24 ENCOUNTER — Encounter: Payer: Self-pay | Admitting: Internal Medicine

## 2023-03-24 VITALS — BP 128/74 | HR 81 | Temp 98.2°F | Ht 64.0 in | Wt 190.0 lb

## 2023-03-24 DIAGNOSIS — S82831E Other fracture of upper and lower end of right fibula, subsequent encounter for open fracture type I or II with routine healing: Secondary | ICD-10-CM

## 2023-03-24 DIAGNOSIS — S2220XD Unspecified fracture of sternum, subsequent encounter for fracture with routine healing: Secondary | ICD-10-CM | POA: Diagnosis not present

## 2023-03-24 DIAGNOSIS — H539 Unspecified visual disturbance: Secondary | ICD-10-CM

## 2023-03-24 DIAGNOSIS — E278 Other specified disorders of adrenal gland: Secondary | ICD-10-CM | POA: Diagnosis not present

## 2023-03-24 DIAGNOSIS — S2241XD Multiple fractures of ribs, right side, subsequent encounter for fracture with routine healing: Secondary | ICD-10-CM

## 2023-03-24 NOTE — Patient Instructions (Signed)
Continue Spirometer
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
## 2023-03-28 ENCOUNTER — Encounter: Payer: Self-pay | Admitting: Occupational Therapy

## 2023-03-28 ENCOUNTER — Ambulatory Visit: Payer: 59 | Admitting: Physical Therapy

## 2023-03-28 ENCOUNTER — Ambulatory Visit: Payer: 59 | Admitting: Occupational Therapy

## 2023-03-28 DIAGNOSIS — R2681 Unsteadiness on feet: Secondary | ICD-10-CM

## 2023-03-28 DIAGNOSIS — M6281 Muscle weakness (generalized): Secondary | ICD-10-CM

## 2023-03-28 DIAGNOSIS — M25571 Pain in right ankle and joints of right foot: Secondary | ICD-10-CM

## 2023-03-28 DIAGNOSIS — R41842 Visuospatial deficit: Secondary | ICD-10-CM
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
## 2023-03-30 ENCOUNTER — Encounter: Payer: Self-pay | Admitting: Internal Medicine

## 2023-03-30 ENCOUNTER — Ambulatory Visit: Payer: 59 | Admitting: Physical Therapy

## 2023-03-30 ENCOUNTER — Encounter: Payer: 59 | Admitting: Occupational Therapy

## 2023-04-04 ENCOUNTER — Encounter: Payer: 59 | Admitting: Occupational Therapy

## 2023-04-04 ENCOUNTER — Ambulatory Visit: Payer: 59 | Admitting: Physical Therapy

## 2023-04-06 ENCOUNTER — Ambulatory Visit: Payer: 59 | Admitting: Physical Therapy

## 2023-04-06 ENCOUNTER — Encounter: Payer: 59 | Admitting: Occupational Therapy

## 2023-04-12 ENCOUNTER — Encounter: Payer: Self-pay | Admitting: Physical Therapy

## 2023-04-12 ENCOUNTER — Ambulatory Visit: Payer: 59 | Attending: General Surgery | Admitting: Physical Therapy

## 2023-04-12 ENCOUNTER — Ambulatory Visit: Payer: 59 | Admitting: Occupational Therapy

## 2023-04-12 DIAGNOSIS — R278 Other lack of coordination: Secondary | ICD-10-CM | POA: Insufficient documentation

## 2023-04-12 DIAGNOSIS — R2681 Unsteadiness on feet: Secondary | ICD-10-CM | POA: Insufficient documentation

## 2023-04-12 DIAGNOSIS — M25571 Pain in right ankle and joints of right foot: Secondary | ICD-10-CM | POA: Diagnosis present

## 2023-04-12 DIAGNOSIS — M6281 Muscle weakness (generalized): Secondary | ICD-10-CM | POA: Insufficient documentation
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
## 2023-04-14 ENCOUNTER — Ambulatory Visit: Payer: 59 | Admitting: Physical Therapy

## 2023-04-14 ENCOUNTER — Ambulatory Visit: Payer: 59 | Admitting: Occupational Therapy

## 2023-04-14 DIAGNOSIS — R278 Other lack of coordination: Secondary | ICD-10-CM

## 2023-04-14 DIAGNOSIS — R2681 Unsteadiness on feet: Secondary | ICD-10-CM

## 2023-04-14 DIAGNOSIS — M6281 Muscle weakness (generalized): Secondary | ICD-10-CM

## 2023-04-14 DIAGNOSIS — M25571 Pain in right ankle and joints of right foot: Secondary | ICD-10-CM
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
## 2023-04-14 NOTE — Patient Instructions (Signed)
Theraband Exercises  Access Code: [email or web address] URL: [email or web address] Date: 04/14/2023 Prepared by: Amada Kingfisher  Exercises - Seated Shoulder Horizontal Abduction with Resistance  - 1-2 x daily - 10 reps - Seated Shoulder Flexion with Self-Anchored Resistance  - 1-2 x daily - 10 reps - Seated Elbow Flexion with Self-Anchored Resistance  - 1-2 x daily - 10 reps - Seated Elbow Extension with Self-Anchored Resistance  - 1-2 x daily - 10 reps

## 2023-04-25 ENCOUNTER — Ambulatory Visit: Payer: 59 | Admitting: Physical Therapy

## 2023-04-27 ENCOUNTER — Ambulatory Visit: Payer: 59 | Admitting: Physical Therapy

## 2023-05-04 ENCOUNTER — Encounter: Payer: Self-pay | Admitting: Occupational Therapy

## 2023-05-04 ENCOUNTER — Ambulatory Visit: Payer: 59 | Admitting: Occupational Therapy

## 2023-05-04 ENCOUNTER — Encounter: Payer: Self-pay | Admitting: Physical Therapy

## 2023-05-04 ENCOUNTER — Ambulatory Visit: Payer: 59 | Admitting: Physical Therapy

## 2023-05-04 DIAGNOSIS — R2681 Unsteadiness on feet: Secondary | ICD-10-CM

## 2023-05-04 DIAGNOSIS — M25571 Pain in right ankle and joints of right foot: Secondary | ICD-10-CM

## 2023-05-04 DIAGNOSIS — M6281 Muscle weakness (generalized): Secondary | ICD-10-CM

## 2023-05-04 DIAGNOSIS — R278 Other lack of coordination: Secondary | ICD-10-CM

## 2023-05-04 NOTE — Patient Instructions (Signed)
  VISUAL COMPENSATIONS  1. Look for the edge of objects (to the left and/or right) so that you make sure you are seeing all of an object 2. Turn your head when walking, scan from side to side, particularly in busy environments 3. Use an organized scanning pattern. It's usually easier to scan from top to bottom, and left to right (like you are reading) 4. Double check yourself 5. Use a line guide (like a blank piece of paper) or your finger when reading 6. If necessary, place brightly colored tape at end of table or work area as a reminder to always look until you see the tape.
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
## 2023-05-06 ENCOUNTER — Ambulatory Visit: Payer: 59 | Admitting: Physical Therapy

## 2023-05-06 DIAGNOSIS — R2681 Unsteadiness on feet: Secondary | ICD-10-CM | POA: Diagnosis not present

## 2023-05-06 DIAGNOSIS — M6281 Muscle weakness (generalized): Secondary | ICD-10-CM

## 2023-05-06 DIAGNOSIS — M25571 Pain in right ankle and joints of right foot: Secondary | ICD-10-CM

## 2023-05-06 DIAGNOSIS — R278 Other lack of coordination: Secondary | ICD-10-CM
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
## 2023-05-09 ENCOUNTER — Ambulatory Visit: Payer: 59 | Admitting: Physical Therapy

## 2023-05-09 ENCOUNTER — Ambulatory Visit: Payer: 59 | Admitting: Occupational Therapy

## 2023-05-09 ENCOUNTER — Encounter: Payer: Self-pay | Admitting: Physical Therapy

## 2023-05-09 DIAGNOSIS — M6281 Muscle weakness (generalized): Secondary | ICD-10-CM

## 2023-05-09 DIAGNOSIS — R2681 Unsteadiness on feet: Secondary | ICD-10-CM

## 2023-05-09 DIAGNOSIS — M25571 Pain in right ankle and joints of right foot: Secondary | ICD-10-CM
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
## 2023-05-11 ENCOUNTER — Encounter: Payer: 59 | Admitting: Occupational Therapy

## 2023-05-11 ENCOUNTER — Encounter: Payer: Self-pay | Admitting: Physical Therapy

## 2023-05-11 ENCOUNTER — Ambulatory Visit: Payer: 59 | Admitting: Physical Therapy

## 2023-05-11 DIAGNOSIS — M25571 Pain in right ankle and joints of right foot: Secondary | ICD-10-CM

## 2023-05-11 DIAGNOSIS — R2681 Unsteadiness on feet: Secondary | ICD-10-CM

## 2023-05-11 DIAGNOSIS — M6281 Muscle weakness (generalized): Secondary | ICD-10-CM

## 2023-05-11 DIAGNOSIS — R278 Other lack of coordination: Secondary | ICD-10-CM
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
## 2023-05-16 ENCOUNTER — Encounter: Payer: 59 | Admitting: Occupational Therapy

## 2023-05-16 ENCOUNTER — Encounter: Payer: Self-pay | Admitting: Physical Therapy

## 2023-05-16 ENCOUNTER — Ambulatory Visit: Payer: PRIVATE HEALTH INSURANCE | Attending: Internal Medicine | Admitting: Physical Therapy

## 2023-05-16 DIAGNOSIS — M6281 Muscle weakness (generalized): Secondary | ICD-10-CM | POA: Insufficient documentation

## 2023-05-16 DIAGNOSIS — R2681 Unsteadiness on feet: Secondary | ICD-10-CM | POA: Insufficient documentation

## 2023-05-16 DIAGNOSIS — M25571 Pain in right ankle and joints of right foot: Secondary | ICD-10-CM | POA: Insufficient documentation
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
## 2023-05-18 ENCOUNTER — Encounter: Payer: 59 | Admitting: Occupational Therapy

## 2023-05-18 ENCOUNTER — Ambulatory Visit: Payer: PRIVATE HEALTH INSURANCE | Admitting: Physical Therapy

## 2023-05-18 DIAGNOSIS — M25571 Pain in right ankle and joints of right foot: Secondary | ICD-10-CM

## 2023-05-18 DIAGNOSIS — R2681 Unsteadiness on feet: Secondary | ICD-10-CM

## 2023-05-18 DIAGNOSIS — M6281 Muscle weakness (generalized): Secondary | ICD-10-CM
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
## 2023-05-23 ENCOUNTER — Ambulatory Visit: Payer: 59 | Admitting: Physical Therapy

## 2023-05-25 ENCOUNTER — Ambulatory Visit: Payer: PRIVATE HEALTH INSURANCE | Admitting: Physical Therapy

## 2023-06-01 ENCOUNTER — Ambulatory Visit: Payer: 59 | Admitting: Physical Therapy

## 2023-08-09 ENCOUNTER — Ambulatory Visit: Payer: Self-pay | Admitting: Student

## 2023-08-18 ENCOUNTER — Encounter: Payer: Self-pay | Admitting: Physical Therapy

## 2023-08-25 ENCOUNTER — Other Ambulatory Visit: Payer: Self-pay

## 2023-08-25 ENCOUNTER — Encounter (HOSPITAL_COMMUNITY): Payer: Self-pay | Admitting: Student

## 2023-08-25 NOTE — Anesthesia Preprocedure Evaluation (Addendum)
Anesthesia Evaluation  Patient identified by MRN, date of birth, ID band Patient awake    Reviewed: Allergy & Precautions, NPO status , Patient's Chart, lab work & pertinent test results  Airway Mallampati: I  TM Distance: >3 FB Neck ROM: Full    Dental no notable dental hx. (+) Teeth Intact, Dental Advisory Given   Pulmonary former smoker   Pulmonary exam normal breath sounds clear to auscultation       Cardiovascular (-) hypertension(-) angina (-) Past MI Normal cardiovascular exam Rhythm:Regular Rate:Normal     Neuro/Psych  negative psych ROS   GI/Hepatic ,GERD  Controlled and Medicated,,  Endo/Other    Renal/GU      Musculoskeletal Myofascial pain syndrome   Abdominal  (+) + obese (BmI 35.8)  Peds  Hematology   Anesthesia Other Findings   Reproductive/Obstetrics                             Anesthesia Physical Anesthesia Plan  ASA: 2  Anesthesia Plan: General   Post-op Pain Management: Tylenol PO (pre-op)*   Induction: Intravenous  PONV Risk Score and Plan: 4 or greater and Treatment may vary due to age or medical condition, Midazolam, Ondansetron and Dexamethasone  Airway Management Planned: LMA  Additional Equipment: None  Intra-op Plan:   Post-operative Plan: Extubation in OR  Informed Consent: I have reviewed the patients History and Physical, chart, labs and discussed the procedure including the risks, benefits and alternatives for the proposed anesthesia with the patient or authorized representative who has indicated his/her understanding and acceptance.     Dental advisory given  Plan Discussed with: CRNA and Surgeon  Anesthesia Plan Comments:        Anesthesia Quick Evaluation

## 2023-08-25 NOTE — H&P (Signed)
[facility] (OTS) H&P  Patient ID: Jessica Lane MRN: [id number] DOB/AGE: [birth year] 63 y.o.  Reason for surgery: Removal of hardware right ankle  HPI: Jessica Lane is an 63 y.o. female with PMH significant for GERD presenting for hardware removal from right ankle.  Patient was involved in [facility] in August 2024, unfortunately resulting in right open ankle fracture.  Patient underwent ORIF of the right ankle by Dr. Jena Gauss on 03/02/2023.  Over the last 6 months, patient is been able to advance to weightbearing as tolerated but unfortunately she continues to have loss of motion in her right ankle, particularly with dorsiflexion.  This loss of motion makes it difficult for patient to complete all of her daily activities and to adequately clear the floor with her foot when ambulating.  She presents now for hardware removal.  Her ankle fracture noted to be fully healed on most recent imaging.  Patient ambulates with no assistive device.  Not currently on any anticoagulation.  Past Medical History:  Diagnosis Date   GERD (gastroesophageal reflux disease)    History of chicken pox    Myofascial pain dysfunction syndrome     Past Surgical History:  Procedure Laterality Date   CESAREAN SECTION     1988 & 1991   ORIF ANKLE FRACTURE Right 03/02/2023   Procedure: IRRIGATION AND DEBRIDEMENT; OPEN REDUCTION INTERNAL FIXATION (ORIF) RIGHT ANKLE FRACTURE;  Surgeon: Roby Lofts, MD;  Location: [facility] OR;  Service: Orthopedics;  Laterality: Right;    Family History  Problem Relation Age of Onset   Diabetes Mother    Kidney disease Mother    Arthritis Father    Cancer Father        Prostate   Diabetes Father    Heart attack Father    Depression Sister    Coronary artery disease Maternal Grandmother    Heart disease Maternal Grandfather    Suicidality Paternal Grandfather        59    Social History:  reports that she has been smoking. She has never used smokeless tobacco. She reports  current alcohol use. She reports that she does not use drugs.  Allergies: No Known Allergies  Medications: I have reviewed the patient's current medications. Prior to Admission:  No medications prior to admission.    ROS: Constitutional: No fever or chills Vision: No changes in vision ENT: No difficulty swallowing CV: No chest pain Pulm: No SOB or wheezing GI: No nausea or vomiting GU: No urgency or inability to hold urine Skin: No poor wound healing Neurologic: No numbness or tingling Psychiatric: No depression or anxiety Heme: No bruising Allergic: No reaction to medications or food   Exam: Last menstrual period 07/31/2010. General: No acute distress Orientation: Alert and oriented x 4 Mood and Affect: Mood affect appropriate, pleasant and cooperative Gait: Within normal limits Coordination and balance: Within normal limits  Right lower extremity: Well-healed surgical incision and traumatic laceration to the ankle.  Minimally tender with palpation of the medial lateral ankle.  Patient near full plantarflexion of the ankle.  Dorsiflexion limited both actively and passively.  Motor and sensory function intact to all aspects of the foot.  Neurovascularly intact  Left lower extremity: Skin without lesions. No tenderness to palpation. Full painless ROM, full strength in each muscle group without evidence of instability.  Motor and sensory function at baseline.  Neurovascularly intact   Medical Decision Making: Data: Imaging: AP, lateral, mortise view of the right ankle show fully healed medial and  lateral malleolus fractures.  All hardware appears stable with no signs of any hardware failure or loosening.  Ankle mortise well-maintained and symmetric  Labs: No results found for this or any previous visit (from the past week).   Medical history and chart was reviewed and case discussed with medical provider.  Assessment/Plan: 63 year old female status post ORIF of right ankle  03/02/2023   Patient has gone to fully heal her fracture unfortunately continues to have limitations with ankle range of motion, particularly regaining full dorsiflexion.  At this point, would recommend proceeding with hardware removal and attempt to allow patient full range of motion of the right ankle.  Risks and benefits of the procedure have been discussed with the patient. Risks discussed included bleeding, infection, new fracture to the right ankle, damage to surrounding nerves and blood vessels, pain, continued loss of range of motion and ankle stiffness, DVT/PE, compartment syndrome, and even anesthesia complications.  Patient states understanding these risks and agrees to proceed with surgery.  Will plan discharge patient home postoperatively from the PACU.  She will be allowed to continue weightbearing as tolerated on the right lower extremity.    Thompson Caul PA-C [facility] [phone] (office) [email or web address]

## 2023-08-25 NOTE — Progress Notes (Signed)
SDW call  Patient was given pre-op instructions over the phone. Patient verbalized understanding of instructions provided.     PCP - Salvatore Decent, FNP Cardiologist -  Pulmonary:    PPM/ICD - denies Device Orders - na Rep Notified - na   Chest x-ray - 03/02/2023 EKG -  03/03/2023 Stress Test - ECHO -  Cardiac Cath -   Sleep Study/sleep apnea/CPAP: denies  Non-diabetic  Blood Thinner Instructions: denies Aspirin Instructions:denies   ERAS Protcol - Clears until 0430   Anesthesia review: No   Patient denies shortness of breath, fever, cough and chest pain over the phone call  Your procedure is scheduled on Friday August 26, 2023  Report to [facility] Main Entrance "A" at  0530   A.M., then check in with the Admitting office.  Call this number if you have problems the morning of surgery:  [phone]   If you have any questions prior to your surgery date call [phone]: Open Monday-Friday 8am-4pm If you experience any cold or flu symptoms such as cough, fever, chills, shortness of breath, etc. between now and your scheduled surgery, please notify us at the above number    Remember:  Do not eat after midnight the night before your surgery  You may drink clear liquids until   0430  the morning of your surgery.   Clear liquids allowed are: Water, Non-Citrus Juices (without pulp), Carbonated Beverages, Clear Tea, Black Coffee ONLY (NO MILK, CREAM OR POWDERED CREAMER of any kind), and Gatorade   Take these medicines the morning of surgery with A SIP OF WATER: None  As of today, STOP taking any Aspirin (unless otherwise instructed by your surgeon) Aleve, Naproxen, Ibuprofen, Motrin, Advil, Goody's, BC's, all herbal medications, fish oil, and all vitamins.

## 2023-08-26 ENCOUNTER — Encounter (HOSPITAL_COMMUNITY): Payer: Self-pay | Admitting: Student

## 2023-08-26 ENCOUNTER — Other Ambulatory Visit: Payer: Self-pay

## 2023-08-26 ENCOUNTER — Encounter (HOSPITAL_COMMUNITY)
Admission: RE | Disposition: A | Discharge status: Home / Self Care | Payer: Self-pay | Source: Home / Self Care | Attending: Student

## 2023-08-26 ENCOUNTER — Ambulatory Visit (HOSPITAL_COMMUNITY): Payer: BC Managed Care – PPO

## 2023-08-26 ENCOUNTER — Ambulatory Visit (HOSPITAL_COMMUNITY): Payer: Self-pay | Admitting: Anesthesiology

## 2023-08-26 ENCOUNTER — Ambulatory Visit (HOSPITAL_COMMUNITY)
Admission: RE | Admit: 2023-08-26 | Discharge: 2023-08-26 | Disposition: A | Discharge status: Home / Self Care | Payer: BC Managed Care – PPO | Attending: Student | Admitting: Student

## 2023-08-26 DIAGNOSIS — K219 Gastro-esophageal reflux disease without esophagitis: Secondary | ICD-10-CM | POA: Diagnosis not present

## 2023-08-26 DIAGNOSIS — T8484XA Pain due to internal orthopedic prosthetic devices, implants and grafts, initial encounter: Secondary | ICD-10-CM | POA: Insufficient documentation

## 2023-08-26 DIAGNOSIS — Z6835 Body mass index (BMI) 35.0-35.9, adult: Secondary | ICD-10-CM | POA: Diagnosis not present

## 2023-08-26 DIAGNOSIS — F17200 Nicotine dependence, unspecified, uncomplicated: Secondary | ICD-10-CM | POA: Diagnosis not present

## 2023-08-26 DIAGNOSIS — E669 Obesity, unspecified: Secondary | ICD-10-CM | POA: Diagnosis not present

## 2023-08-26 DIAGNOSIS — X58XXXA Exposure to other specified factors, initial encounter: Secondary | ICD-10-CM | POA: Diagnosis not present

## 2023-08-26 HISTORY — PX: HARDWARE REMOVAL: SHX979

## 2023-08-26 LAB — CBC
HCT: 40.1 % (ref 36.0–46.0)
Hemoglobin: 12.9 g/dL (ref 12.0–15.0)
MCH: 29.3 pg (ref 26.0–34.0)
MCHC: 32.2 g/dL (ref 30.0–36.0)
MCV: 90.9 fL (ref 80.0–100.0)
Platelets: 341 10*3/uL (ref 150–400)
RBC: 4.41 MIL/uL (ref 3.87–5.11)
RDW: 12.9 % (ref 11.5–15.5)
WBC: 5.2 10*3/uL (ref 4.0–10.5)
nRBC: 0 % (ref 0.0–0.2)

## 2023-08-26 SURGERY — REMOVAL, HARDWARE
Anesthesia: General | Site: Ankle | Laterality: Right

## 2023-08-26 MED ORDER — MIDAZOLAM HCL 2 MG/2ML IJ SOLN
INTRAMUSCULAR | Status: DC | PRN
  Administered 2023-08-26: 2 mg via INTRAVENOUS

## 2023-08-26 MED ORDER — CHLORHEXIDINE GLUCONATE 0.12 % MT SOLN
OROMUCOSAL | Status: AC
  Administered 2023-08-26: 15 mL via OROMUCOSAL
  Filled 2023-08-26: qty 15

## 2023-08-26 MED ORDER — SUCCINYLCHOLINE CHLORIDE 200 MG/10ML IV SOSY
PREFILLED_SYRINGE | INTRAVENOUS | Status: AC
  Filled 2023-08-26: qty 10

## 2023-08-26 MED ORDER — PROPOFOL 10 MG/ML IV BOLUS
INTRAVENOUS | Status: AC
  Filled 2023-08-26: qty 20

## 2023-08-26 MED ORDER — LIDOCAINE 2% (20 MG/ML) 5 ML SYRINGE
INTRAMUSCULAR | Status: DC | PRN
  Administered 2023-08-26: 100 mg via INTRAVENOUS

## 2023-08-26 MED ORDER — FENTANYL CITRATE (PF) 250 MCG/5ML IJ SOLN
INTRAMUSCULAR | Status: AC
  Filled 2023-08-26: qty 5

## 2023-08-26 MED ORDER — PROPOFOL 10 MG/ML IV BOLUS
INTRAVENOUS | Status: DC | PRN
  Administered 2023-08-26: 200 mg via INTRAVENOUS

## 2023-08-26 MED ORDER — VANCOMYCIN HCL 1000 MG IV SOLR
INTRAVENOUS | Status: DC | PRN
  Administered 2023-08-26: 1000 mg

## 2023-08-26 MED ORDER — ACETAMINOPHEN 500 MG PO TABS
1000.0000 mg | ORAL_TABLET | Freq: Once | ORAL | Status: AC
  Administered 2023-08-26: 1000 mg via ORAL
  Filled 2023-08-26: qty 2

## 2023-08-26 MED ORDER — 0.9 % SODIUM CHLORIDE (POUR BTL) OPTIME
TOPICAL | Status: DC | PRN
  Administered 2023-08-26: 1000 mL

## 2023-08-26 MED ORDER — HYDROMORPHONE HCL 1 MG/ML IJ SOLN
0.2500 mg | INTRAMUSCULAR | Status: DC | PRN
  Administered 2023-08-26 (×2): 0.5 mg via INTRAVENOUS

## 2023-08-26 MED ORDER — VANCOMYCIN HCL 1000 MG IV SOLR
INTRAVENOUS | Status: AC
  Filled 2023-08-26: qty 20

## 2023-08-26 MED ORDER — LIDOCAINE 2% (20 MG/ML) 5 ML SYRINGE
INTRAMUSCULAR | Status: AC
Start: 2023-08-26 — End: ?
  Filled 2023-08-26: qty 5

## 2023-08-26 MED ORDER — PHENYLEPHRINE 80 MCG/ML (10ML) SYRINGE FOR IV PUSH (FOR BLOOD PRESSURE SUPPORT)
PREFILLED_SYRINGE | INTRAVENOUS | Status: AC
  Filled 2023-08-26: qty 10

## 2023-08-26 MED ORDER — ORAL CARE MOUTH RINSE: 15.0000 mL | Freq: Once | OROMUCOSAL | Status: AC

## 2023-08-26 MED ORDER — MIDAZOLAM HCL 2 MG/2ML IJ SOLN
INTRAMUSCULAR | Status: AC
  Filled 2023-08-26: qty 2

## 2023-08-26 MED ORDER — CEFAZOLIN SODIUM-DEXTROSE 2-4 GM/100ML-% IV SOLN
2.0000 g | INTRAVENOUS | Status: AC
  Administered 2023-08-26: 2 g via INTRAVENOUS
  Filled 2023-08-26: qty 100

## 2023-08-26 MED ORDER — CHLORHEXIDINE GLUCONATE 0.12 % MT SOLN: 15.0000 mL | Freq: Once | OROMUCOSAL | Status: AC

## 2023-08-26 MED ORDER — HYDROMORPHONE HCL 1 MG/ML IJ SOLN
INTRAMUSCULAR | Status: AC
  Filled 2023-08-26: qty 1

## 2023-08-26 MED ORDER — OXYCODONE HCL 5 MG/5ML PO SOLN: 5.0000 mg | Freq: Once | ORAL | Status: DC | PRN

## 2023-08-26 MED ORDER — FENTANYL CITRATE (PF) 250 MCG/5ML IJ SOLN
INTRAMUSCULAR | Status: DC | PRN
Start: 2023-08-26 — End: 2023-08-26
  Administered 2023-08-26 (×2): 100 ug via INTRAVENOUS
  Administered 2023-08-26: 50 ug via INTRAVENOUS

## 2023-08-26 MED ORDER — DEXAMETHASONE SODIUM PHOSPHATE 10 MG/ML IJ SOLN
INTRAMUSCULAR | Status: DC | PRN
  Administered 2023-08-26: 5 mg via INTRAVENOUS

## 2023-08-26 MED ORDER — OXYCODONE HCL 5 MG PO TABS: 5.0000 mg | ORAL_TABLET | Freq: Once | ORAL | Status: DC | PRN

## 2023-08-26 MED ORDER — KETOROLAC TROMETHAMINE 30 MG/ML IJ SOLN
INTRAMUSCULAR | Status: AC
  Filled 2023-08-26: qty 1

## 2023-08-26 MED ORDER — LACTATED RINGERS IV SOLN
INTRAVENOUS | Status: DC | PRN
Start: 2023-08-26 — End: 2023-08-26

## 2023-08-26 MED ORDER — ONDANSETRON HCL 4 MG/2ML IJ SOLN: 4.0000 mg | Freq: Once | INTRAMUSCULAR | Status: DC | PRN

## 2023-08-26 MED ORDER — PHENYLEPHRINE HCL (PRESSORS) 10 MG/ML IV SOLN
INTRAVENOUS | Status: DC | PRN
  Administered 2023-08-26: 160 ug via INTRAVENOUS

## 2023-08-26 MED ORDER — OXYCODONE HCL 5 MG PO TABS: 5.0000 mg | ORAL_TABLET | ORAL | 0 refills | Status: DC | PRN

## 2023-08-26 MED ORDER — ONDANSETRON HCL 4 MG/2ML IJ SOLN
INTRAMUSCULAR | Status: AC
  Filled 2023-08-26: qty 2

## 2023-08-26 MED ORDER — KETOROLAC TROMETHAMINE 30 MG/ML IJ SOLN
30.0000 mg | Freq: Once | INTRAMUSCULAR | Status: AC | PRN
  Administered 2023-08-26: 30 mg via INTRAVENOUS

## 2023-08-26 MED ORDER — DEXAMETHASONE SODIUM PHOSPHATE 10 MG/ML IJ SOLN
INTRAMUSCULAR | Status: AC
  Filled 2023-08-26: qty 1

## 2023-08-26 SURGICAL SUPPLY — 55 items
BAG COUNTER SPONGE SURGICOUNT (BAG) ×1 IMPLANT
BANDAGE ESMARK 6X9 LF (GAUZE/BANDAGES/DRESSINGS) ×1 IMPLANT
BENZOIN TINCTURE PRP APPL 2/3 (GAUZE/BANDAGES/DRESSINGS) IMPLANT
BNDG COHESIVE 6X5 TAN ST LF (GAUZE/BANDAGES/DRESSINGS) ×1 IMPLANT
BNDG ELASTIC 4INX 5YD STR LF (GAUZE/BANDAGES/DRESSINGS) IMPLANT
BNDG ELASTIC 4X5.8 VLCR STR LF (GAUZE/BANDAGES/DRESSINGS) ×1 IMPLANT
BNDG ELASTIC 6X5.8 VLCR STR LF (GAUZE/BANDAGES/DRESSINGS) ×1 IMPLANT
BNDG ESMARK 6X9 LF (GAUZE/BANDAGES/DRESSINGS)
BNDG GAUZE DERMACEA FLUFF 4 (GAUZE/BANDAGES/DRESSINGS) ×2 IMPLANT
BRUSH SCRUB EZ PLAIN DRY (MISCELLANEOUS) ×2 IMPLANT
CHLORAPREP W/TINT 26 (MISCELLANEOUS) ×1 IMPLANT
COVER SURGICAL LIGHT HANDLE (MISCELLANEOUS) ×2 IMPLANT
CUFF TOURN SGL QUICK 18X4 (TOURNIQUET CUFF) IMPLANT
CUFF TRNQT CYL 24X4X16.5-23 (TOURNIQUET CUFF) IMPLANT
CUFF TRNQT CYL 34X4.125X (TOURNIQUET CUFF) IMPLANT
DRAPE C-ARM 42X72 X-RAY (DRAPES) IMPLANT
DRAPE C-ARMOR (DRAPES) ×1 IMPLANT
DRAPE U-SHAPE 47X51 STRL (DRAPES) ×1 IMPLANT
DRSG ADAPTIC 3X8 NADH LF (GAUZE/BANDAGES/DRESSINGS) ×1 IMPLANT
ELECT REM PT RETURN 9FT ADLT (ELECTROSURGICAL) ×1
ELECTRODE REM PT RTRN 9FT ADLT (ELECTROSURGICAL) ×1 IMPLANT
GAUZE SPONGE 4X4 12PLY STRL (GAUZE/BANDAGES/DRESSINGS) ×1 IMPLANT
GLOVE BIO SURGEON STRL SZ 6.5 (GLOVE) ×3 IMPLANT
GLOVE BIO SURGEON STRL SZ7.5 (GLOVE) ×4 IMPLANT
GLOVE BIOGEL PI IND STRL 6.5 (GLOVE) ×1 IMPLANT
GLOVE BIOGEL PI IND STRL 7.5 (GLOVE) ×1 IMPLANT
GOWN STRL REUS W/ TWL LRG LVL3 (GOWN DISPOSABLE) ×2 IMPLANT
KIT BASIN OR (CUSTOM PROCEDURE TRAY) ×1 IMPLANT
KIT TURNOVER KIT B (KITS) ×1 IMPLANT
MANIFOLD NEPTUNE II (INSTRUMENTS) ×1 IMPLANT
NDL 22X1.5 STRL (OR ONLY) (MISCELLANEOUS) IMPLANT
NEEDLE 22X1.5 STRL (OR ONLY) (MISCELLANEOUS)
NS IRRIG 1000ML POUR BTL (IV SOLUTION) ×1 IMPLANT
PACK ORTHO EXTREMITY (CUSTOM PROCEDURE TRAY) ×1 IMPLANT
PAD ARMBOARD 7.5X6 YLW CONV (MISCELLANEOUS) ×2 IMPLANT
PAD CAST 4YDX4 CTTN HI CHSV (CAST SUPPLIES) IMPLANT
PADDING CAST COTTON 6X4 STRL (CAST SUPPLIES) ×3 IMPLANT
SPONGE T-LAP 18X18 ~~LOC~~+RFID (SPONGE) ×1 IMPLANT
STAPLER VISISTAT 35W (STAPLE) IMPLANT
STOCKINETTE IMPERVIOUS LG (DRAPES) ×1 IMPLANT
STRIP CLOSURE SKIN 1/2X4 (GAUZE/BANDAGES/DRESSINGS) IMPLANT
SUCTION TUBE FRAZIER 10FR DISP (SUCTIONS) IMPLANT
SUT ETHILON 3 0 PS 1 (SUTURE) IMPLANT
SUT MNCRL AB 3-0 PS2 18 (SUTURE) ×1 IMPLANT
SUT MON AB 2-0 CT1 36 (SUTURE) ×1 IMPLANT
SUT PDS AB 2-0 CT1 27 (SUTURE) IMPLANT
SUT VIC AB 0 CT1 27XBRD ANBCTR (SUTURE) IMPLANT
SUT VIC AB 2-0 CT1 TAPERPNT 27 (SUTURE) IMPLANT
SYR CONTROL 10ML LL (SYRINGE) IMPLANT
TOWEL GREEN STERILE (TOWEL DISPOSABLE) ×2 IMPLANT
TOWEL GREEN STERILE FF (TOWEL DISPOSABLE) ×2 IMPLANT
TUBE CONNECTING 12X1/4 (SUCTIONS) ×1 IMPLANT
UNDERPAD 30X36 HEAVY ABSORB (UNDERPADS AND DIAPERS) ×1 IMPLANT
WATER STERILE IRR 1000ML POUR (IV SOLUTION) ×2 IMPLANT
YANKAUER SUCT BULB TIP NO VENT (SUCTIONS) ×1 IMPLANT

## 2023-08-26 NOTE — Interval H&P Note (Signed)
History and Physical Interval Note:  08/26/2023 7:27 AM  Jessica Lane  has presented today for surgery, with the diagnosis of Right ankle hardware pain.  The various methods of treatment have been discussed with the patient and family. After consideration of risks, benefits and other options for treatment, the patient has consented to  Procedure(s): HARDWARE REMOVAL ANKLE (Right) as a surgical intervention.  The patient's history has been reviewed, patient examined, no change in status, stable for surgery.  I have reviewed the patient's chart and labs.  Questions were answered to the patient's satisfaction.     Caryn Bee P Omarion Minnehan

## 2023-08-26 NOTE — Anesthesia Procedure Notes (Signed)
Procedure Name: LMA Insertion Date/Time: 08/26/2023 7:40 AM  Performed by: Camillia Herter, CRNAPre-anesthesia Checklist: Patient identified, Emergency Drugs available, Suction available and Patient being monitored Patient Re-evaluated:Patient Re-evaluated prior to induction Oxygen Delivery Method: Circle System Utilized Preoxygenation: Pre-oxygenation with 100% oxygen Induction Type: IV induction Ventilation: Mask ventilation without difficulty LMA: LMA inserted LMA Size: 4.0 Number of attempts: 1 Airway Equipment and Method: Bite block Placement Confirmation: positive ETCO2 Tube secured with: Tape Dental Injury: Teeth and Oropharynx as per pre-operative assessment

## 2023-08-26 NOTE — Discharge Instructions (Signed)
Orthopaedic Trauma Service Discharge Instructions   General Discharge Instructions  WEIGHT BEARING STATUS: Weightbearing as tolerated  RANGE OF MOTION/ACTIVITY:Unrestricted range of motion  Wound Care: You may remove your surgical dressing on post op day 3 (Monday 08/29/23). Leave white ster-strips in place over the ankle incisions. Incisions can be left open to air if there is no drainage. Once the incision is completely dry and without drainage, it may be left open to air out.  Showering may begin post op day 4 (Tuesday 08/30/23).  Clean incision gently with soap and water.  DVT/PE prophylaxis: None  Diet: as you were eating previously.  Can use over the counter stool softeners and bowel preparations, such as Miralax, to help with bowel movements.  Narcotics can be constipating.  Be sure to drink plenty of fluids  PAIN MEDICATION USE AND EXPECTATIONS  You have likely been given narcotic medications to help control your pain.  After a traumatic event that results in an fracture (broken bone) with or without surgery, it is ok to use narcotic pain medications to help control one's pain.  We understand that everyone responds to pain differently and each individual patient will be evaluated on a regular basis for the continued need for narcotic medications. Ideally, narcotic medication use should last no more than 6-8 weeks (coinciding with fracture healing).   As a patient it is your responsibility as well to monitor narcotic medication use and report the amount and frequency you use these medications when you come to your office visit.   We would also advise that if you are using narcotic medications, you should take a dose prior to therapy to maximize you participation.  IF YOU ARE ON NARCOTIC MEDICATIONS IT IS NOT PERMISSIBLE TO OPERATE A MOTOR VEHICLE (MOTORCYCLE/CAR/TRUCK/MOPED) OR HEAVY MACHINERY DO NOT MIX NARCOTICS WITH OTHER CNS (CENTRAL NERVOUS SYSTEM) DEPRESSANTS SUCH AS  ALCOHOL   STOP SMOKING OR USING NICOTINE PRODUCTS!!!!  As discussed nicotine severely impairs your body's ability to heal surgical and traumatic wounds but also impairs bone healing.  Wounds and bone heal by forming microscopic blood vessels (angiogenesis) and nicotine is a vasoconstrictor (essentially, shrinks blood vessels).  Therefore, if vasoconstriction occurs to these microscopic blood vessels they essentially disappear and are unable to deliver necessary nutrients to the healing tissue.  This is one modifiable factor that you can do to dramatically increase your chances of healing your injury.    (This means no smoking, no nicotine gum, patches, etc)  DO NOT USE NONSTEROIDAL ANTI-INFLAMMATORY DRUGS (NSAID'S)  Using products such as Advil (ibuprofen), Aleve (naproxen), Motrin (ibuprofen) for additional pain control during fracture healing can delay and/or prevent the healing response.  If you would like to take over the counter (OTC) medication, Tylenol (acetaminophen) is ok.  However, some narcotic medications that are given for pain control contain acetaminophen as well. Therefore, you should not exceed more than 4000 mg of tylenol in a day if you do not have liver disease.  Also note that there are may OTC medicines, such as cold medicines and allergy medicines that my contain tylenol as well.  If you have any questions about medications and/or interactions please ask your doctor/PA or your pharmacist.      ICE AND ELEVATE INJURED/OPERATIVE EXTREMITY  Using ice and elevating the injured extremity above your heart can help with swelling and pain control.  Icing in a pulsatile fashion, such as 20 minutes on and 20 minutes off, can be followed.    Do  not place ice directly on skin. Make sure there is a barrier between to skin and the ice pack.    Using frozen items such as frozen peas works well as the conform nicely to the are that needs to be iced.  USE AN ACE WRAP OR TED HOSE FOR SWELLING  CONTROL  In addition to icing and elevation, Ace wraps or TED hose are used to help limit and resolve swelling.  It is recommended to use Ace wraps or TED hose until you are informed to stop.    When using Ace Wraps start the wrapping distally (farthest away from the body) and wrap proximally (closer to the body)   Example: If you had surgery on your leg or thing and you do not have a splint on, start the ace wrap at the toes and work your way up to the thigh        If you had surgery on your upper extremity and do not have a splint on, start the ace wrap at your fingers and work your way up to the upper arm   CALL THE OFFICE WITH ANY QUESTIONS OR CONCERNS: [phone]   VISIT OUR WEBSITE FOR ADDITIONAL INFORMATION: [email or web address]    Discharge Wound Care Instructions  Do NOT apply any ointments, solutions or lotions to pin sites or surgical wounds.  These prevent needed drainage and even though solutions like hydrogen peroxide kill bacteria, they also damage cells lining the pin sites that help fight infection.  Applying lotions or ointments can keep the wounds moist and can cause them to breakdown and open up as well. This can increase the risk for infection. When in doubt call the office.  Surgical incisions should be dressed daily.  If any drainage is noted, use one layer of adaptic or Mepitel, then gauze, Kerlix, and an ace wrap. - These dressing supplies should be available at local medical supply stores [facility], etc) as well as Insurance claims handler ([facility], [facility], [place], etc)  Once the incision is completely dry and without drainage, it may be left open to air out.  Showering may begin 36-48 hours later.  Cleaning gently with soap and water.

## 2023-08-26 NOTE — Op Note (Signed)
Orthopaedic Surgery Operative Note (CSN: [id number] ) Date of Surgery: 08/26/2023  Admit Date: 08/26/2023   Diagnoses: Pre-Op Diagnoses: Healed right ankle fracture Painful ankle with restricted range of motion  Post-Op Diagnosis: Same  Procedures: CPT 20680-Removal of hardware right ankle  Surgeons : Primary: Roby Lofts, MD  Assistant: Ulyses Southward, PA-C  Location: OR 3   Anesthesia: General   Antibiotics: Ancef 2g preop with 1gm vancomycin powder placed topically   Tourniquet time: None    Estimated Blood Loss: Minimal  Complications:* No complications entered in OR log *   Specimens:* No specimens in log *   Implants: * No implants in log *   Indications for Surgery: 63 year old female who sustained a right ankle open fracture dislocation in August 2024.  She subsequently went on to heal her fracture but continued to have some residual pain and decreased range of motion.  She did have syndesmotic screws in place that likely were limiting her dorsiflexion.  Due to the continued pain and limited range of motion I recommend proceeding with hardware removal.  Risks and benefits were discussed with the patient and her husband.  Risks included but not limited to bleeding, infection, refracture, nerve and blood vessel injury, ankle stiffness, continued pain, posttraumatic arthritis, even the possibility anesthetic complications.  She agreed to proceed with surgery and consent was obtained.  Operative Findings: Successful removal of previous hardware from right ankle without complication.  Procedure: The patient was identified in the preoperative holding area. Consent was confirmed with the patient and their family and all questions were answered. The operative extremity was marked after confirmation with the patient. she was then brought back to the operating room by our anesthesia colleagues.  She was carefully transferred over to radiolucent flattop table.  She was placed  under general anesthetic.  The right lower extremity was then prepped and draped in usual sterile fashion.  A timeout was performed to verify the patient, the procedure, and the extremity.  Preoperative antibiotics were dosed.  Fluoroscopic imaging showed the hardware that was in place.  I reopened the lateral incision carried it down through skin and subcutaneous tissue.  I then exposed the plate and removed all 6 of the screws including the syndesmosis screws.  I remove the plate and debrided the soft tissue around the bone.  The fracture was fully healed and consolidated.  I then percutaneously removed the medial malleolus screws under fluoroscopy.  I then removed the anterior to posterior screw for the tibial plafond.  This is all done without complication.  Final fluoroscopic imaging showed that all the hardware was removed.  The incision was irrigated and a gram of vancomycin powder was placed.  Layered closure of 2-0 Monocryl and 3-0 Monocryl was used to close the incisions.  Steri-Strips were used to reinforce the incisions.  A sterile dressing was applied.  Patient was then awoke from anesthesia and taken to the PACU in stable condition.  Post Op Plan/Instructions: Patient be weightbearing as tolerated to the right lower extremity.  She will discharge home from the PACU.  No DVT prophylaxis is needed in this ambulatory patient.  Will have her return in approximately 2 weeks for wound check and x-rays.  I was present and performed the entire surgery.  Ulyses Southward, PA-C did assist me throughout the case. An assistant was necessary given the difficulty in approach, maintenance of reduction and ability to instrument the fracture.   Truitt Merle, MD [facility]

## 2023-08-26 NOTE — Transfer of Care (Signed)
Immediate Anesthesia Transfer of Care Note  Patient: Jessica Lane  Procedure(s) Performed: HARDWARE REMOVAL RIGHT ANKLE (Right: Ankle)  Patient Location: PACU  Anesthesia Type:General  Level of Consciousness: awake, alert , and oriented  Airway & Oxygen Therapy: Patient Spontanous Breathing  Post-op Assessment: Report given to RN  Post vital signs: Reviewed and stable  Last Vitals:  Vitals Value Taken Time  BP 132/71 08/26/23 0845  Temp 36.6 C 08/26/23 0830  Pulse 76 08/26/23 0846  Resp 12 08/26/23 0846  SpO2 95 % 08/26/23 0846  Vitals shown include unfiled device data.  Last Pain:  Vitals:   08/26/23 0616  TempSrc:   PainSc: 0-No pain         Complications: No notable events documented.

## 2023-08-26 NOTE — Anesthesia Postprocedure Evaluation (Signed)
Anesthesia Post Note  Patient: Jessica Lane  Procedure(s) Performed: HARDWARE REMOVAL RIGHT ANKLE (Right: Ankle)     Patient location during evaluation: PACU Anesthesia Type: General Level of consciousness: awake and alert Pain management: pain level controlled Vital Signs Assessment: post-procedure vital signs reviewed and stable Respiratory status: spontaneous breathing, nonlabored ventilation, respiratory function stable and patient connected to nasal cannula oxygen Cardiovascular status: blood pressure returned to baseline and stable Postop Assessment: no apparent nausea or vomiting Anesthetic complications: no  No notable events documented.  Last Vitals:  Vitals:   08/26/23 0900 08/26/23 0915  BP: 136/68 132/81  Pulse: 67 71  Resp: 10 17  Temp:  36.4 C  SpO2: 96% 99%    Last Pain:  Vitals:   08/26/23 0900  TempSrc:   PainSc: 8                  Trevor Iha

## 2023-08-27 ENCOUNTER — Encounter (HOSPITAL_COMMUNITY): Payer: Self-pay | Admitting: Student

## 2023-09-23 ENCOUNTER — Other Ambulatory Visit: Payer: Self-pay

## 2023-09-23 ENCOUNTER — Encounter: Payer: Self-pay | Admitting: Physical Therapy

## 2023-09-23 ENCOUNTER — Ambulatory Visit: Attending: Student | Admitting: Physical Therapy

## 2023-09-23 DIAGNOSIS — R2689 Other abnormalities of gait and mobility: Secondary | ICD-10-CM | POA: Diagnosis present

## 2023-09-23 DIAGNOSIS — M6281 Muscle weakness (generalized): Secondary | ICD-10-CM | POA: Insufficient documentation

## 2023-09-23 DIAGNOSIS — M25571 Pain in right ankle and joints of right foot: Secondary | ICD-10-CM | POA: Insufficient documentation

## 2023-09-23 DIAGNOSIS — M25671 Stiffness of right ankle, not elsewhere classified: Secondary | ICD-10-CM | POA: Diagnosis present

## 2023-09-23 DIAGNOSIS — R278 Other lack of coordination: Secondary | ICD-10-CM | POA: Insufficient documentation

## 2023-09-23 DIAGNOSIS — R2681 Unsteadiness on feet: Secondary | ICD-10-CM | POA: Insufficient documentation
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
## 2023-09-28 ENCOUNTER — Ambulatory Visit: Admitting: Physical Therapy

## 2023-09-28 ENCOUNTER — Encounter: Payer: Self-pay | Admitting: Physical Therapy

## 2023-09-28 DIAGNOSIS — M25571 Pain in right ankle and joints of right foot: Secondary | ICD-10-CM

## 2023-09-28 DIAGNOSIS — M6281 Muscle weakness (generalized): Secondary | ICD-10-CM

## 2023-09-28 DIAGNOSIS — M25671 Stiffness of right ankle, not elsewhere classified: Secondary | ICD-10-CM | POA: Diagnosis not present

## 2023-09-28 DIAGNOSIS — R278 Other lack of coordination: Secondary | ICD-10-CM

## 2023-09-28 DIAGNOSIS — R2689 Other abnormalities of gait and mobility: Secondary | ICD-10-CM

## 2023-09-28 DIAGNOSIS — R2681 Unsteadiness on feet: Secondary | ICD-10-CM
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
## 2023-09-28 NOTE — Patient Instructions (Signed)
 Access Code: [email or web address] URL: [email or web address] Date: 09/28/2023 Prepared by: Camille Bal  Exercises - Seated Ankle Alphabet  - 1 x daily - 5 x weekly - 3 sets - 10 reps - Long Sitting Ankle Pumps  - 2 x daily - 7 x weekly - 1 sets - 10-20 reps - Sit to Stand with Armchair  - 2 x daily - 7 x weekly - 2 sets - 10 reps - Seated Hamstring Stretch  - 2 x daily - 7 x weekly - 3 sets - 30 hold - Wide Stance with Eyes Closed on Foam Pad  - 1 x daily - 5 x weekly - 2 sets - 5 reps - Calf stretch  - 1 x daily - 7 x weekly - 3 sets - 45-60 seconds hold - Ankle Inversion Eversion Towel Slide  - 1 x daily - 5 x weekly - 3 sets - 10 reps - Long Sitting Calf Stretch with Strap  - 1 x daily - 7 x weekly - 3 sets - 45-60 seconds hold

## 2023-10-05 ENCOUNTER — Ambulatory Visit: Admitting: Physical Therapy

## 2023-10-05 ENCOUNTER — Encounter: Payer: Self-pay | Admitting: Physical Therapy

## 2023-10-05 DIAGNOSIS — R278 Other lack of coordination: Secondary | ICD-10-CM

## 2023-10-05 DIAGNOSIS — R2681 Unsteadiness on feet: Secondary | ICD-10-CM

## 2023-10-05 DIAGNOSIS — M25671 Stiffness of right ankle, not elsewhere classified: Secondary | ICD-10-CM | POA: Diagnosis not present

## 2023-10-05 DIAGNOSIS — M25571 Pain in right ankle and joints of right foot: Secondary | ICD-10-CM

## 2023-10-05 DIAGNOSIS — M6281 Muscle weakness (generalized): Secondary | ICD-10-CM

## 2023-10-05 DIAGNOSIS — R2689 Other abnormalities of gait and mobility: Secondary | ICD-10-CM

## 2023-10-05 NOTE — Patient Instructions (Signed)
 Access Code: [email or web address] URL: [email or web address] Date: 09/28/2023 Prepared by: Camille Bal  Exercises - Seated Ankle Alphabet  - 1 x daily - 5 x weekly - 3 sets - 10 reps - Long Sitting Ankle Pumps  - 2 x daily - 7 x weekly - 1 sets - 10-20 reps - Sit to Stand with Armchair  - 2 x daily - 7 x weekly - 2 sets - 10 reps - Seated Hamstring Stretch  - 2 x daily - 7 x weekly - 3 sets - 30 hold - Wide Stance with Eyes Closed on Foam Pad  - 1 x daily - 5 x weekly - 2 sets - 5 reps - Calf stretch  - 1 x daily - 7 x weekly - 3 sets - 45-60 seconds hold - Ankle Inversion Eversion Towel Slide  - 1 x daily - 5 x weekly - 3 sets - 10 reps - Long Sitting Calf Stretch with Strap  - 1 x daily - 7 x weekly - 3 sets - 45-60 seconds hold - Side Stepping with Resistance at Thighs and Counter Support  - 1 x daily - 5 x weekly - 3 sets - 10 reps - Forward Monster Walk with Resistance at [employer] and Counter Support  - 1 x daily - 5 x weekly - 3 sets - 10 reps - Backward Monster Walk with Resistance at [employer] and Counter Support  - 1 x daily - 5 x weekly - 3 sets - 10 reps
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
## 2023-10-14 ENCOUNTER — Encounter: Payer: Self-pay | Admitting: Physical Therapy

## 2023-10-14 ENCOUNTER — Ambulatory Visit: Attending: Student | Admitting: Physical Therapy

## 2023-10-14 DIAGNOSIS — R2689 Other abnormalities of gait and mobility: Secondary | ICD-10-CM | POA: Diagnosis present

## 2023-10-14 DIAGNOSIS — M6281 Muscle weakness (generalized): Secondary | ICD-10-CM | POA: Diagnosis present

## 2023-10-14 DIAGNOSIS — R278 Other lack of coordination: Secondary | ICD-10-CM | POA: Insufficient documentation

## 2023-10-14 DIAGNOSIS — M25671 Stiffness of right ankle, not elsewhere classified: Secondary | ICD-10-CM | POA: Insufficient documentation

## 2023-10-14 DIAGNOSIS — M25571 Pain in right ankle and joints of right foot: Secondary | ICD-10-CM | POA: Insufficient documentation

## 2023-10-14 DIAGNOSIS — R2681 Unsteadiness on feet: Secondary | ICD-10-CM | POA: Insufficient documentation
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
## 2023-10-19 ENCOUNTER — Ambulatory Visit: Admitting: Physical Therapy

## 2023-10-19 ENCOUNTER — Encounter: Payer: Self-pay | Admitting: Physical Therapy

## 2023-10-19 DIAGNOSIS — M6281 Muscle weakness (generalized): Secondary | ICD-10-CM

## 2023-10-19 DIAGNOSIS — M25571 Pain in right ankle and joints of right foot: Secondary | ICD-10-CM

## 2023-10-19 DIAGNOSIS — R278 Other lack of coordination: Secondary | ICD-10-CM

## 2023-10-19 DIAGNOSIS — M25671 Stiffness of right ankle, not elsewhere classified: Secondary | ICD-10-CM

## 2023-10-19 DIAGNOSIS — R2689 Other abnormalities of gait and mobility: Secondary | ICD-10-CM

## 2023-10-19 DIAGNOSIS — R2681 Unsteadiness on feet: Secondary | ICD-10-CM
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
## 2023-10-26 ENCOUNTER — Ambulatory Visit: Admitting: Physical Therapy

## 2023-10-26 DIAGNOSIS — M25571 Pain in right ankle and joints of right foot: Secondary | ICD-10-CM

## 2023-10-26 DIAGNOSIS — M6281 Muscle weakness (generalized): Secondary | ICD-10-CM

## 2023-10-26 DIAGNOSIS — R2689 Other abnormalities of gait and mobility: Secondary | ICD-10-CM
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
## 2023-11-02 ENCOUNTER — Ambulatory Visit: Admitting: Physical Therapy

## 2023-11-02 ENCOUNTER — Encounter: Payer: Self-pay | Admitting: Physical Therapy

## 2023-11-02 DIAGNOSIS — M25571 Pain in right ankle and joints of right foot: Secondary | ICD-10-CM

## 2023-11-02 DIAGNOSIS — M6281 Muscle weakness (generalized): Secondary | ICD-10-CM

## 2023-11-02 DIAGNOSIS — M25671 Stiffness of right ankle, not elsewhere classified: Secondary | ICD-10-CM

## 2023-11-02 DIAGNOSIS — R2681 Unsteadiness on feet: Secondary | ICD-10-CM

## 2023-11-02 DIAGNOSIS — R2689 Other abnormalities of gait and mobility: Secondary | ICD-10-CM

## 2023-11-02 DIAGNOSIS — R278 Other lack of coordination: Secondary | ICD-10-CM
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
## 2023-11-09 ENCOUNTER — Ambulatory Visit: Admitting: Physical Therapy

## 2023-11-16 ENCOUNTER — Ambulatory Visit: Admitting: Physical Therapy

## 2023-11-30 ENCOUNTER — Encounter: Payer: Self-pay | Admitting: Physical Therapy

## 2023-11-30 ENCOUNTER — Ambulatory Visit: Attending: Student | Admitting: Physical Therapy

## 2023-11-30 DIAGNOSIS — R278 Other lack of coordination: Secondary | ICD-10-CM | POA: Diagnosis present

## 2023-11-30 DIAGNOSIS — R2689 Other abnormalities of gait and mobility: Secondary | ICD-10-CM | POA: Diagnosis present

## 2023-11-30 DIAGNOSIS — M25671 Stiffness of right ankle, not elsewhere classified: Secondary | ICD-10-CM | POA: Diagnosis present

## 2023-11-30 DIAGNOSIS — R2681 Unsteadiness on feet: Secondary | ICD-10-CM | POA: Diagnosis present

## 2023-11-30 DIAGNOSIS — M25571 Pain in right ankle and joints of right foot: Secondary | ICD-10-CM | POA: Diagnosis present

## 2023-11-30 DIAGNOSIS — M6281 Muscle weakness (generalized): Secondary | ICD-10-CM | POA: Insufficient documentation
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
## 2023-12-14 ENCOUNTER — Telehealth: Payer: Self-pay | Admitting: Internal Medicine

## 2023-12-14 NOTE — Telephone Encounter (Unsigned)
 Copied from CRM [id number]. Topic: Clinical - Request for Lab/Test Order >> Dec 14, 2023  2:18 PM Jessica Lane wrote: Reason for CRM: Pt request to take blood work before physical 06/11. Pt would like be contacted once labs order - [id number]

## 2023-12-21 ENCOUNTER — Encounter: Payer: Self-pay | Admitting: Internal Medicine

## 2023-12-21 ENCOUNTER — Other Ambulatory Visit (HOSPITAL_COMMUNITY)
Admission: RE | Admit: 2023-12-21 | Discharge: 2023-12-21 | Disposition: A | Discharge status: Home / Self Care | Source: Ambulatory Visit | Attending: Internal Medicine | Admitting: Internal Medicine

## 2023-12-21 ENCOUNTER — Ambulatory Visit (INDEPENDENT_AMBULATORY_CARE_PROVIDER_SITE_OTHER): Admitting: Internal Medicine

## 2023-12-21 VITALS — BP 136/78 | HR 96 | Temp 97.4°F | Ht 64.0 in | Wt 218.0 lb

## 2023-12-21 DIAGNOSIS — K760 Fatty (change of) liver, not elsewhere classified: Secondary | ICD-10-CM | POA: Diagnosis not present

## 2023-12-21 DIAGNOSIS — Z1231 Encounter for screening mammogram for malignant neoplasm of breast: Secondary | ICD-10-CM | POA: Diagnosis not present

## 2023-12-21 DIAGNOSIS — Z1211 Encounter for screening for malignant neoplasm of colon: Secondary | ICD-10-CM

## 2023-12-21 DIAGNOSIS — K449 Diaphragmatic hernia without obstruction or gangrene: Secondary | ICD-10-CM | POA: Diagnosis not present

## 2023-12-21 DIAGNOSIS — Z131 Encounter for screening for diabetes mellitus: Secondary | ICD-10-CM

## 2023-12-21 DIAGNOSIS — E279 Disorder of adrenal gland, unspecified: Secondary | ICD-10-CM | POA: Diagnosis not present

## 2023-12-21 DIAGNOSIS — Z124 Encounter for screening for malignant neoplasm of cervix: Secondary | ICD-10-CM | POA: Diagnosis not present

## 2023-12-21 DIAGNOSIS — Z0000 Encounter for general adult medical examination without abnormal findings: Secondary | ICD-10-CM

## 2023-12-21 NOTE — Patient Instructions (Addendum)
 Schedule shingles vaccine - call office for a nurse visit

## 2023-12-21 NOTE — Progress Notes (Signed)
 Subjective:   Jessica Lane [birth year]  12/21/2023   CC: Chief Complaint  Patient presents with   Annual Exam    HPI: Jessica Lane is a 63 y.o. female who presents for a routine health maintenance exam.  Labs collected at time of visit.   Patient recently had full body scan done on 12/15/23 ( see media tab for results). Results reviewed by provider and discussed with patient.  - Calcium score 10 - minimal plaque burden.   - 3.6 right adrenal nodule favoring adenoma or lipoma (has decrease in size since last CT imaging in August 2024.   - Small hiatal hernia   - mild hepatic steatosis   She would like A1C testing to check to diabetes due to family hx.   HEALTH SCREENINGS: - Pap smear: pap done - Mammogram (40+): Ordered today  - Colonoscopy (45+): Ordered today  - Bone Density (65+): Not applicable   Depression and Anxiety Screen done today and results listed below:     12/21/2023    4:00 PM 03/24/2023   10:22 AM 10/11/2017    3:21 PM  Depression screen PHQ 2/9  Decreased Interest 0 0 0  Down, Depressed, Hopeless 0 0 0  PHQ - 2 Score 0 0 0  Altered sleeping 0 0   Tired, decreased energy 1 0   Change in appetite 1 0   Feeling bad or failure about yourself  0 0   Trouble concentrating 0 0   Moving slowly or fidgety/restless 0 0   Suicidal thoughts 0 0   PHQ-9 Score 2 0   Difficult doing work/chores Not difficult at all Not difficult at all       12/21/2023    4:01 PM 03/24/2023   10:22 AM  GAD 7 : Generalized Anxiety Score  Nervous, Anxious, on Edge 0 0  Control/stop worrying 0 0  Worry too much - different things 0 0  Trouble relaxing 0 0  Restless 0 0  Easily annoyed or irritable 0 0  Afraid - awful might happen 0 0  Total GAD 7 Score 0 0  Anxiety Difficulty Not difficult at all Not difficult at all    IMMUNIZATIONS: - Tdap: Tetanus vaccination status reviewed: last tetanus booster within 10 years. - HPV: Not applicable - Influenza: Postponed to flu  season - Prevnar 20 : Up to date - Zostavax (50+): declined- she will call to schedule a nurse visit at later date   Past medical history, surgical history, medications, allergies, family history and social history reviewed with patient today and changes made to appropriate areas of the chart.   Past Medical History:  Diagnosis Date   GERD (gastroesophageal reflux disease)    History of chicken pox    Myofascial pain dysfunction syndrome     Past Surgical History:  Procedure Laterality Date   CESAREAN SECTION     1988 & 1991   HARDWARE REMOVAL Right 08/26/2023   Procedure: HARDWARE REMOVAL RIGHT ANKLE;  Surgeon: Laneta Pintos, MD;  Location: [facility];  Service: Orthopedics;  Laterality: Right;   ORIF ANKLE FRACTURE Right 03/02/2023   Procedure: IRRIGATION AND DEBRIDEMENT; OPEN REDUCTION INTERNAL FIXATION (ORIF) RIGHT ANKLE FRACTURE;  Surgeon: Laneta Pintos, MD;  Location: [facility] OR;  Service: Orthopedics;  Laterality: Right;    Current Outpatient Medications on File Prior to Visit  Medication Sig   acetaminophen  (TYLENOL ) 500 MG tablet Take 2 tablets (1,000 mg total) by mouth every 6 (six) hours.   b  complex vitamins capsule Take 1 capsule by mouth daily.   Omega-3 Fatty Acids (OMEGA 3 PO) Take 1 Can by mouth daily.   oxyCODONE  (ROXICODONE ) 5 MG immediate release tablet Take 1 tablet (5 mg total) by mouth every 4 (four) hours as needed for severe pain (pain score 7-10).   Probiotic Product (PROBIOTIC DAILY PO) Take 1 capsule by mouth daily.   Vitamin D , Ergocalciferol , (DRISDOL ) 1.25 MG (50000 UNIT) CAPS capsule Take 1 capsule (50,000 Units total) by mouth every 7 (seven) days.   No current facility-administered medications on file prior to visit.    No Known Allergies   Social History   Socioeconomic History   Marital status: Married    Spouse name: Not on file   Number of children: Not on file   Years of education: Not on file   Highest education level: Some college, no  degree  Occupational History   Not on file  Tobacco Use   Smoking status: Former    Current packs/day: 0.00    Types: Cigarettes    Quit date: 03/02/2023    Years since quitting: 0.8   Smokeless tobacco: Never  Vaping Use   Vaping status: Never Used  Substance and Sexual Activity   Alcohol use: Yes    Comment: Occas.   Drug use: Never   Sexual activity: Yes  Other Topics Concern   Not on file  Social History Narrative   Not on file   Social Drivers of Health   Financial Resource Strain: Low Risk  (12/19/2023)   Overall Financial Resource Strain (CARDIA)    Difficulty of Paying Living Expenses: Not hard at all  Food Insecurity: No Food Insecurity (12/19/2023)   Hunger Vital Sign    Worried About Running Out of Food in the Last Year: Never true    Ran Out of Food in the Last Year: Never true  Transportation Needs: No Transportation Needs (12/19/2023)   PRAPARE - Administrator, Civil Service (Medical): No    Lack of Transportation (Non-Medical): No  Physical Activity: Insufficiently Active (12/19/2023)   Exercise Vital Sign    Days of Exercise per Week: 3 days    Minutes of Exercise per Session: 40 min  Stress: No Stress Concern Present (12/19/2023)   [employer] of Occupational Health - Occupational Stress Questionnaire    Feeling of Stress : Only a little  Social Connections: Moderately Integrated (12/19/2023)   Social Connection and Isolation Panel [NHANES]    Frequency of Communication with Friends and Family: More than three times a week    Frequency of Social Gatherings with Friends and Family: Once a week    Attends Religious Services: 1 to 4 times per year    Active Member of [employer] or Organizations: No    Attends Banker Meetings: Not on file    Marital Status: Married  Catering manager Violence: Not At Risk (03/02/2023)   Humiliation, Afraid, Rape, and Kick questionnaire    Fear of Current or Ex-Partner: No    Emotionally Abused: No     Physically Abused: No    Sexually Abused: No   Social History   Tobacco Use  Smoking Status Former   Current packs/day: 0.00   Types: Cigarettes   Quit date: 03/02/2023   Years since quitting: 0.8  Smokeless Tobacco Never   Social History   Substance and Sexual Activity  Alcohol Use Yes   Comment: Occas.    Family History  Problem Relation  Age of Onset   Diabetes Mother    Kidney disease Mother    Arthritis Father    Cancer Father        Prostate   Diabetes Father    Heart attack Father    Depression Sister    Coronary artery disease Maternal Grandmother    Heart disease Maternal Grandfather    Suicidality Paternal Grandfather        1968     ROS: Denies fever, fatigue, unexplained weight loss/gain, hearing or vision changes, cardiac or respiratory complaints. Denies neurological deficits, musculoskeletal complaints, gastrointestinal or genitourinary complaints, mental health complaints, and skin changes.   Objective:   Today's Vitals   12/21/23 1457  BP: 136/78  Pulse: 96  Temp: (!) 97.4 F (36.3 C)  TempSrc: Temporal  SpO2: 97%  Weight: 218 lb (98.9 kg)  Height: 5' 4 (1.626 m)    GENERAL APPEARANCE: Well-appearing, in NAD. Well nourished.  SKIN: Pink, warm and dry. Turgor normal. No rash, lesion, ulceration, or ecchymoses. Hair evenly distributed.  HEENT: HEAD: Normocephalic.  EYES: PERRLA. EOMI. Lids intact w/o defect. Sclera white, Conjunctiva pink w/o exudate.  EARS: External ear w/o redness, swelling, masses or lesions. EAC clear. TM's intact, translucent w/o bulging, appropriate landmarks visualized. Appropriate acuity to conversational tones.  NOSE: Septum midline w/o deformity. Nares patent, mucosa pink and non-inflamed w/o drainage.  THROAT: Uvula midline. Oropharynx clear. Tonsils non-inflamed w/o exudate. Oral mucosa pink and moist.  NECK: Supple, Trachea midline. Full ROM w/o pain or tenderness. No lymphadenopathy. Thyroid non-tender w/o  enlargement or palpable masses.  BREASTS: Breasts pendulous, symmetrical, and w/o palpable masses. Nipples everted and w/o discharge. No rash or skin retraction. No axillary or supraclavicular lymphadenopathy.  RESPIRATORY: Chest wall symmetrical w/o masses. Respirations even and non-labored. Breath sounds clear to auscultation bilaterally. No wheezes, rales, rhonchi, or crackles. CARDIAC: S1, S2 present, regular rate and rhythm. No gallops, murmurs, rubs, or clicks. No carotid bruits. Capillary refill <2 seconds. Peripheral pulses 2+ bilaterally. GI: Abdomen soft w/o distention. Normoactive bowel sounds. No palpable masses or tenderness. No guarding or rebound tenderness. Liver and spleen w/o tenderness or enlargement. No CVA tenderness.  GU:  External genitalia without erythema, lesions, or masses. No lymphadenopathy. Vaginal mucosa pink and moist without exudate, lesions, or ulcerations. Cervix pink without discharge. Cervical os closed. Uterus and adnexae palpable, not enlarged, and w/o tenderness. No palpable masses.  MSK: Muscle tone and strength appropriate for age, w/o atrophy or abnormal movement.  EXTREMITIES: Active ROM intact, w/o tenderness, crepitus, or contracture. No obvious joint deformities or effusions. No clubbing, edema, or cyanosis.  NEUROLOGIC: CN's II-XII intact. Motor strength symmetrical with no obvious weakness. No sensory deficits. Steady, even gait.  PSYCH/MENTAL STATUS: Alert, oriented x 3. Cooperative, appropriate mood and affect.   Chaperoned by Stefani Edin CMA   Assessment & Plan:  1. Encounter for general adult medical examination without abnormal findings (Primary) - CBC with Differential/Platelet - Comprehensive metabolic panel with GFR - TSH - Lipid panel  2. Cervical cancer screening - Cytology - PAP  3. Breast cancer screening by mammogram - MM 3D SCREENING MAMMOGRAM BILATERAL BREAST; Future  4. Colon cancer screening - Ambulatory referral to  Gastroenterology  5. Screening for diabetes mellitus - Hemoglobin A1C  6. Hepatic steatosis - discussed healthy eating and regular exercise to help with fatty liver   7. Adrenal nodule (HCC) - stable, continue yearly follow ups to ensure stability   8. Hiatal hernia - small hernia noted  on CT scan , continue monitoring.    Orders Placed This Encounter  Procedures   MM 3D SCREENING MAMMOGRAM BILATERAL BREAST    Standing Status:   Future    Expiration Date:   12/20/2024    Reason for Exam (SYMPTOM  OR DIAGNOSIS REQUIRED):   screening for breast cancer    [facility]    Is the patient pregnant?:   No   CBC with Differential/Platelet   Comprehensive metabolic panel with GFR   TSH   Lipid panel   Hemoglobin A1C   Ambulatory referral to Gastroenterology    Referral Priority:   Routine    Referral Type:   Consultation    Referral Reason:   Specialty Services Required    Number of Visits Requested:   1    PATIENT COUNSELING:  - Encourage a healthy well-balanced diet. Patient may adjust caloric intake to maintain or achieve ideal body weight. May reduce intake of dietary saturated fat and total fat and have adequate dietary potassium and calcium preferably from fresh fruits, vegetables, and low-fat dairy products.   - Stress the importance of regular exercise  NEXT PREVENTATIVE PHYSICAL DUE IN 1 YEAR.  Return in about 1 year (around 12/20/2024) for Annual Physical Exam with fasting lab work.  Gavin Kast, FNP

## 2023-12-22 LAB — CBC WITH DIFFERENTIAL/PLATELET
Absolute Lymphocytes: 2077 {cells}/uL (ref 850–3900)
Absolute Monocytes: 503 {cells}/uL (ref 200–950)
Basophils Absolute: 40 {cells}/uL (ref 0–200)
Basophils Relative: 0.6 %
Eosinophils Absolute: 127 {cells}/uL (ref 15–500)
Eosinophils Relative: 1.9 %
HCT: 41.8 % (ref 35.0–45.0)
Hemoglobin: 13.8 g/dL (ref 11.7–15.5)
MCH: 28.9 pg (ref 27.0–33.0)
MCHC: 33 g/dL (ref 32.0–36.0)
MCV: 87.6 fL (ref 80.0–100.0)
MPV: 9.5 fL (ref 7.5–12.5)
Monocytes Relative: 7.5 %
Neutro Abs: 3953 {cells}/uL (ref 1500–7800)
Neutrophils Relative %: 59 %
Platelets: 423 10*3/uL — ABNORMAL HIGH (ref 140–400)
RBC: 4.77 10*6/uL (ref 3.80–5.10)
RDW: 13 % (ref 11.0–15.0)
Total Lymphocyte: 31 %
WBC: 6.7 10*3/uL (ref 3.8–10.8)

## 2023-12-22 LAB — LIPID PANEL
Cholesterol: 217 mg/dL — ABNORMAL HIGH (ref <200)
HDL: 66 mg/dL (ref >=50)
LDL Cholesterol (Calc): 128 mg/dL — ABNORMAL HIGH
Non-HDL Cholesterol (Calc): 151 mg/dL — ABNORMAL HIGH (ref <130)
Total CHOL/HDL Ratio: 3.3 (calc) (ref <5.0)
Triglycerides: 115 mg/dL (ref <150)

## 2023-12-22 LAB — COMPREHENSIVE METABOLIC PANEL WITH GFR
AG Ratio: 1.7 (calc) (ref 1.0–2.5)
ALT: 23 U/L (ref 6–29)
AST: 18 U/L (ref 10–35)
Albumin: 4.5 g/dL (ref 3.6–5.1)
Alkaline phosphatase (APISO): 105 U/L (ref 37–153)
BUN: 16 mg/dL (ref 7–25)
CO2: 25 mmol/L (ref 20–32)
Calcium: 9.9 mg/dL (ref 8.6–10.4)
Chloride: 102 mmol/L (ref 98–110)
Creat: 0.63 mg/dL (ref 0.50–1.05)
Globulin: 2.7 g/dL (ref 1.9–3.7)
Glucose, Bld: 97 mg/dL (ref 65–99)
Potassium: 4 mmol/L (ref 3.5–5.3)
Sodium: 140 mmol/L (ref 135–146)
Total Bilirubin: 0.4 mg/dL (ref 0.2–1.2)
Total Protein: 7.2 g/dL (ref 6.1–8.1)
eGFR: 101 mL/min/{1.73_m2} (ref >=60)

## 2023-12-22 LAB — HEMOGLOBIN A1C
Hgb A1c MFr Bld: 6.2 % — ABNORMAL HIGH (ref <5.7)
Mean Plasma Glucose: 131 mg/dL
eAG (mmol/L): 7.3 mmol/L

## 2023-12-22 LAB — TSH: TSH: 2.28 m[IU]/L (ref 0.40–4.50)

## 2023-12-23 ENCOUNTER — Ambulatory Visit: Payer: Self-pay | Admitting: Internal Medicine

## 2023-12-23 DIAGNOSIS — R7303 Prediabetes: Secondary | ICD-10-CM | POA: Insufficient documentation

## 2024-01-05 ENCOUNTER — Ambulatory Visit (INDEPENDENT_AMBULATORY_CARE_PROVIDER_SITE_OTHER)

## 2024-01-05 VITALS — Temp 98.7°F | Ht 64.0 in | Wt 218.4 lb

## 2024-01-05 DIAGNOSIS — Z23 Encounter for immunization: Secondary | ICD-10-CM

## 2024-01-05 NOTE — Progress Notes (Signed)
 Per orders of Rosina Senters, FNP/Dr. Berneta, injection of Shingles  given by Joeseph Piety in left deltoid. Patient tolerated injection well. Patient will make appointment for 2-6 months.

## 2024-01-12 ENCOUNTER — Encounter: Payer: Self-pay | Admitting: Pediatrics

## 2024-02-01 ENCOUNTER — Ambulatory Visit

## 2024-02-02 ENCOUNTER — Ambulatory Visit
Admission: RE | Admit: 2024-02-02 | Discharge: 2024-02-02 | Disposition: A | Discharge status: Home / Self Care | Source: Ambulatory Visit | Attending: Internal Medicine | Admitting: Internal Medicine

## 2024-02-02 DIAGNOSIS — Z1231 Encounter for screening mammogram for malignant neoplasm of breast: Secondary | ICD-10-CM

## 2024-02-09 ENCOUNTER — Telehealth: Payer: Self-pay | Admitting: *Deleted

## 2024-02-09 ENCOUNTER — Encounter: Payer: Self-pay | Admitting: Internal Medicine

## 2024-02-09 ENCOUNTER — Ambulatory Visit (AMBULATORY_SURGERY_CENTER): Admitting: *Deleted

## 2024-02-09 VITALS — Ht 64.0 in | Wt 220.0 lb

## 2024-02-09 DIAGNOSIS — Z1211 Encounter for screening for malignant neoplasm of colon: Secondary | ICD-10-CM

## 2024-02-09 MED ORDER — NA SULFATE-K SULFATE-MG SULF 17.5-3.13-1.6 GM/177ML PO SOLN: 1.0000 | Freq: Once | ORAL | 0 refills | Status: AC

## 2024-02-09 NOTE — Telephone Encounter (Signed)
 Attempt to reach pt for pre-visit. LM with call back #.  Will attempt to reach again in 5 min due to no other # listed in profile  Second attempt to reach pt for pre-vist   Pt called back

## 2024-02-09 NOTE — Progress Notes (Signed)
 Pt's name and DOB verified at the beginning of the pre-visit with 2 identifiers   Pt denies any difficulty with ambulating,sitting, laying down or rolling side to side   Pt uses ambulation assistance device or has issues with mobiiity  Pt has no issues moving head neck or swallowing  No egg or soy allergy known to patient   No issues known to pt with past sedation with any surgeries or procedures  Patient denies ever being intubated  No FH of Malignant Hyperthermia  Pt is not on home 02   Pt is not on blood thinners   Pt denies issues with constipation   Pt is not on dialysis  Pt denise any abnormal heart rhythms   Pt denies any upcoming cardiac testing  Patient's chart reviewed by Norleen Schillings CNRA prior to pre-visit and patient appropriate for the [facility].  Pre-visit completed and red dot placed by patient's name on their procedure day (on provider's schedule).    Visit by phone   Pt Scale weight is 220 lb  IInstructions reviewed. Pt given  both [facility] main # and MD on call # prior to instructions.  Informed pt to come inat the time discussed and that the procedure and that it is an hour prior to the actual procedure time due to need to place IV, change into gown, check and sign concent as well as meet CRNA and MD. Pt states they understand. paperwork s Pt states understanding after opportunity to ask questions the instructions given. Instructed pt to review instructions again prior to procedure and call main # given if has any questions.. Pt states they will.   Instructed pt on where to find instructions on My Chart.

## 2024-02-19 NOTE — Progress Notes (Signed)
 [facility] History and Physical   Primary Care Physician:  Billy Knee, FNP   Reason for Procedure:  Screening colonoscopy  Plan:    Colorectal cancer screening     HPI: Crissy Mccreadie is a 63 y.o. female undergoing screening colonoscopy for colorectal cancer screening.  This is the patient's first colonoscopy.  No family history of colorectal cancer or polyps.  Patient denies current symptoms of change in bowel habits or rectal bleeding at the time of this exam.   Past Medical History:  Diagnosis Date   GERD (gastroesophageal reflux disease)    Hepatic vein stenosis    History of chicken pox    Hypertension    Myofascial pain dysfunction syndrome     Past Surgical History:  Procedure Laterality Date   CESAREAN SECTION     1988 & 1991   HARDWARE REMOVAL Right 08/26/2023   Procedure: HARDWARE REMOVAL RIGHT ANKLE;  Surgeon: Kendal Franky SQUIBB, MD;  Location: [facility] OR;  Service: Orthopedics;  Laterality: Right;   ORIF ANKLE FRACTURE Right 03/02/2023   Procedure: IRRIGATION AND DEBRIDEMENT; OPEN REDUCTION INTERNAL FIXATION (ORIF) RIGHT ANKLE FRACTURE;  Surgeon: Kendal Franky SQUIBB, MD;  Location: [facility] OR;  Service: Orthopedics;  Laterality: Right;    Prior to Admission medications   Medication Sig Start Date End Date Taking? Authorizing Provider  acetaminophen  (TYLENOL ) 500 MG tablet Take 2 tablets (1,000 mg total) by mouth every 6 (six) hours. Patient taking differently: Take 1,000 mg by mouth as needed. 03/04/23   Augustus Almarie RAMAN, PA-C  b complex vitamins capsule Take 1 capsule by mouth daily.    [provider]  Omega-3 Fatty Acids (OMEGA 3 PO) Take 1 Can by mouth daily.    [provider]  oxyCODONE  (ROXICODONE ) 5 MG immediate release tablet Take 1 tablet (5 mg total) by mouth every 4 (four) hours as needed for severe pain (pain score 7-10). 08/26/23   Danton Lauraine LABOR, PA-C  Probiotic Product (PROBIOTIC DAILY PO) Take 1 capsule by mouth daily.     [provider]  Turmeric (QC TUMERIC COMPLEX PO) Take by mouth.    [provider]  Vitamin D , Ergocalciferol , (DRISDOL ) 1.25 MG (50000 UNIT) CAPS capsule Take 1 capsule (50,000 Units total) by mouth every 7 (seven) days. 03/10/23   Augustus Almarie RAMAN, PA-C    Current Outpatient Medications  Medication Sig Dispense Refill   b complex vitamins capsule Take 1 capsule by mouth daily.     Omega-3 Fatty Acids (OMEGA 3 PO) Take 1 Can by mouth daily.     Probiotic Product (PROBIOTIC DAILY PO) Take 1 capsule by mouth daily.     Turmeric (QC TUMERIC COMPLEX PO) Take by mouth.     Vitamin D , Ergocalciferol , (DRISDOL ) 1.25 MG (50000 UNIT) CAPS capsule Take 1 capsule (50,000 Units total) by mouth every 7 (seven) days. 5 capsule 0   acetaminophen  (TYLENOL ) 500 MG tablet Take 2 tablets (1,000 mg total) by mouth every 6 (six) hours. (Patient taking differently: Take 1,000 mg by mouth as needed.) 30 tablet 0   Current Facility-Administered Medications  Medication Dose Route Frequency Provider Last Rate Last Admin   0.9 %  sodium chloride  infusion  500 mL Intravenous Continuous Shaterra Sanzone, Inocente HERO, MD        Allergies as of 02/20/2024 - Review Complete 02/20/2024  Allergen Reaction Noted   Egg-derived products Other (See Comments) 02/09/2024   Soy allergy (obsolete) Other (See Comments) 02/09/2024    Family History  Problem  Relation Age of Onset   Diabetes Mother    Kidney disease Mother    Arthritis Father    Cancer Father        Prostate   Diabetes Father    Heart attack Father    Depression Sister    Coronary artery disease Maternal Grandmother    Heart disease Maternal Grandfather    Suicidality Paternal Grandfather        1968   Colon cancer Neg Hx    Colon polyps Neg Hx    Esophageal cancer Neg Hx    Rectal cancer Neg Hx    Stomach cancer Neg Hx     Social History   Socioeconomic History   Marital status: Married    Spouse name: Not on file   Number of  children: Not on file   Years of education: Not on file   Highest education level: Some college, no degree  Occupational History   Not on file  Tobacco Use   Smoking status: Former    Current packs/day: 0.00    Types: Cigarettes    Quit date: 03/02/2023    Years since quitting: 0.9   Smokeless tobacco: Never  Vaping Use   Vaping status: Never Used  Substance and Sexual Activity   Alcohol use: Yes    Comment: Occas.   Drug use: Never   Sexual activity: Yes  Other Topics Concern   Not on file  Social History Narrative   Not on file   Social Drivers of Health   Financial Resource Strain: Low Risk  (12/19/2023)   Overall Financial Resource Strain (CARDIA)    Difficulty of Paying Living Expenses: Not hard at all  Food Insecurity: No Food Insecurity (12/19/2023)   Hunger Vital Sign    Worried About Running Out of Food in the Last Year: Never true    Ran Out of Food in the Last Year: Never true  Transportation Needs: No Transportation Needs (12/19/2023)   PRAPARE - Administrator, Civil Service (Medical): No    Lack of Transportation (Non-Medical): No  Physical Activity: Insufficiently Active (12/19/2023)   Exercise Vital Sign    Days of Exercise per Week: 3 days    Minutes of Exercise per Session: 40 min  Stress: No Stress Concern Present (12/19/2023)   [employer] of Occupational Health - Occupational Stress Questionnaire    Feeling of Stress : Only a little  Social Connections: Moderately Integrated (12/19/2023)   Social Connection and Isolation Panel    Frequency of Communication with Friends and Family: More than three times a week    Frequency of Social Gatherings with Friends and Family: Once a week    Attends Religious Services: 1 to 4 times per year    Active Member of [employer] or Organizations: No    Attends Banker Meetings: Not on file    Marital Status: Married  Catering manager Violence: Not At Risk (03/02/2023)   Humiliation, Afraid, Rape, and  Kick questionnaire    Fear of Current or Ex-Partner: No    Emotionally Abused: No    Physically Abused: No    Sexually Abused: No    Review of Systems:  All other review of systems negative except as mentioned in the HPI.  Physical Exam: Vital signs BP (!) 143/87   Pulse 82   Temp 98.1 F (36.7 C) (Temporal)   Resp 11   Ht 5' 4 (1.626 m)   Wt 220 lb (99.8 kg)  LMP 07/31/2010   SpO2 98%   BMI 37.76 kg/m   General:   Alert,  Well-developed, well-nourished, pleasant and cooperative in NAD Airway:  Mallampati 1 Lungs:  Clear throughout to auscultation.   Heart:  Regular rate and rhythm; no murmurs, clicks, rubs,  or gallops. Abdomen:  Soft, nontender and nondistended. Normal bowel sounds.   Neuro/Psych:  Normal mood and affect. A and O x 3  Inocente Hausen, MD [facility]

## 2024-02-20 ENCOUNTER — Encounter: Payer: Self-pay | Admitting: Pediatrics

## 2024-02-20 ENCOUNTER — Ambulatory Visit: Admitting: Pediatrics

## 2024-02-20 VITALS — BP 112/65 | HR 74 | Temp 98.1°F | Resp 11 | Ht 64.0 in | Wt 220.0 lb

## 2024-02-20 DIAGNOSIS — K635 Polyp of colon: Secondary | ICD-10-CM

## 2024-02-20 DIAGNOSIS — D125 Benign neoplasm of sigmoid colon: Secondary | ICD-10-CM

## 2024-02-20 DIAGNOSIS — K621 Rectal polyp: Secondary | ICD-10-CM

## 2024-02-20 DIAGNOSIS — D129 Benign neoplasm of anus and anal canal: Secondary | ICD-10-CM

## 2024-02-20 DIAGNOSIS — Z1211 Encounter for screening for malignant neoplasm of colon: Secondary | ICD-10-CM | POA: Diagnosis present

## 2024-02-20 DIAGNOSIS — D123 Benign neoplasm of transverse colon: Secondary | ICD-10-CM

## 2024-02-20 DIAGNOSIS — K5140 Inflammatory polyps of colon without complications: Secondary | ICD-10-CM

## 2024-02-20 DIAGNOSIS — D128 Benign neoplasm of rectum: Secondary | ICD-10-CM

## 2024-02-20 MED ORDER — SODIUM CHLORIDE 0.9 % IV SOLN: 500.0000 mL | INTRAVENOUS | Status: DC

## 2024-02-20 NOTE — Op Note (Signed)
 [facility] Patient Name: Jessica Lane Procedure Date: 02/20/2024 10:18 AM MRN: [id number] Endoscopist: Inocente Hausen , MD, [id number] Age: 63 Referring MD:  Date of Birth: 1960/07/29 Gender: Female Account #: [id number] Procedure:                Colonoscopy Indications:              Screening for colorectal malignant neoplasm, This                            is the patient's first colonoscopy Medicines:                Monitored Anesthesia Care Procedure:                Pre-Anesthesia Assessment:                           - Prior to the procedure, a History and Physical                            was performed, and patient medications and                            allergies were reviewed. The patient's tolerance of                            previous anesthesia was also reviewed. The risks                            and benefits of the procedure and the sedation                            options and risks were discussed with the patient.                            All questions were answered, and informed consent                            was obtained. Prior Anticoagulants: The patient has                            taken no anticoagulant or antiplatelet agents. ASA                            Grade Assessment: II - A patient with mild systemic                            disease. After reviewing the risks and benefits,                            the patient was deemed in satisfactory condition to                            undergo the procedure.  After obtaining informed consent, the colonoscope                            was passed under direct vision. Throughout the                            procedure, the patient's blood pressure, pulse, and                            oxygen saturations were monitored continuously. The                            CF [id number] #[id number] was introduced through the anus                            and advanced to the  cecum, identified by                            appendiceal orifice and ileocecal valve. The                            colonoscopy was performed without difficulty. The                            patient tolerated the procedure well. The quality                            of the bowel preparation was good. The ileocecal                            valve, appendiceal orifice, and rectum were                            photographed. Scope In: 10:30:30 AM Scope Out: 10:52:54 AM Scope Withdrawal Time: 0 hours 15 minutes 21 seconds  Total Procedure Duration: 0 hours 22 minutes 24 seconds  Findings:                 The perianal and digital rectal examinations were                            normal. Pertinent negatives include normal                            sphincter tone and no palpable rectal lesions.                           An 11 mm polyp was found in the hepatic flexure.                            The polyp was pedunculated. The polyp was removed                            with a hot snare. Resection and retrieval were  complete.                           Three sessile polyps were found in the sigmoid                            colon and transverse colon. The polyps were 4 to 5                            mm in size. These polyps were removed with a cold                            snare. Resection was complete, but the polyp tissue                            was only partially retrieved.                           Two sessile polyps were found in the rectum. The                            polyps were 3 to 4 mm in size. These polyps were                            removed with a cold biopsy forceps. Resection and                            retrieval were complete.                           The retroflexed view of the distal rectum and anal                            verge was normal and showed no anal or rectal                             abnormalities. Complications:            No immediate complications. Estimated blood loss:                            Minimal. Estimated Blood Loss:     Estimated blood loss was minimal. Impression:               - One 11 mm polyp at the hepatic flexure, removed                            with a hot snare. Resected and retrieved.                           - Three 4 to 5 mm polyps in the sigmoid colon and                            in the transverse colon, removed  with a cold snare.                            Complete resection. Partial retrieval.                           - Two 3 to 4 mm polyps in the rectum, removed with                            a cold biopsy forceps. Resected and retrieved.                           - The distal rectum and anal verge are normal on                            retroflexion view. Recommendation:           - Discharge patient to home (ambulatory).                           - Await pathology results.                           - Repeat colonoscopy for surveillance based on                            pathology results.                           - The findings and recommendations were discussed                            with the patient's family.                           - Return to referring physician.                           - Patient has a contact number available for                            emergencies. The signs and symptoms of potential                            delayed complications were discussed with the                            patient. Return to normal activities tomorrow.                            Written discharge instructions were provided to the                            patient. Inocente Hausen, MD 02/20/2024 10:59:41 AM This report has been signed electronically.

## 2024-02-20 NOTE — Progress Notes (Signed)
 Called to room to assist during endoscopic procedure.  Patient ID and intended procedure confirmed with present staff. Received instructions for my participation in the procedure from the performing physician.

## 2024-02-20 NOTE — Progress Notes (Signed)
 Pt's states no medical or surgical changes since previsit or office visit.

## 2024-02-20 NOTE — Patient Instructions (Signed)

## 2024-02-20 NOTE — Progress Notes (Signed)
 Report given to PACU, vss

## 2024-02-21 ENCOUNTER — Telehealth: Payer: Self-pay | Admitting: *Deleted

## 2024-02-21 NOTE — Telephone Encounter (Signed)
Attempted to call patient for their post-procedure follow-up call. No answer. Left voicemail.   

## 2024-02-23 ENCOUNTER — Ambulatory Visit: Payer: Self-pay | Admitting: Pediatrics

## 2024-02-23 LAB — SURGICAL PATHOLOGY

## 2024-03-22 ENCOUNTER — Ambulatory Visit (INDEPENDENT_AMBULATORY_CARE_PROVIDER_SITE_OTHER)

## 2024-03-22 DIAGNOSIS — Z23 Encounter for immunization: Secondary | ICD-10-CM | POA: Diagnosis not present

## 2024-03-22 NOTE — Progress Notes (Signed)
 Per orders of Rosina Senters, injection of 2nd Shingles vaccine given in LT deltoid by Karna Christ, cma.  Patient tolerated injection well.

## 2024-12-24 ENCOUNTER — Encounter: Admitting: Internal Medicine
